# Patient Record
Sex: Male | Born: 1940 | Race: White | Hispanic: No | Marital: Married | Smoking: Former smoker
Health system: Southern US, Community
[De-identification: ages and names within clinical notes are randomized; demographics above are authoritative.]

## PROBLEM LIST (undated history)

## (undated) DIAGNOSIS — R350 Frequency of micturition: Secondary | ICD-10-CM

## (undated) DIAGNOSIS — M1990 Unspecified osteoarthritis, unspecified site: Secondary | ICD-10-CM

## (undated) HISTORY — PX: TONSILLECTOMY: SUR1361

## (undated) HISTORY — PX: KNEE ARTHROPLASTY: SHX992

## (undated) HISTORY — PX: APPENDECTOMY: SHX54

---

## 2000-03-04 ENCOUNTER — Emergency Department (HOSPITAL_COMMUNITY): Admission: EM | Admit: 2000-03-04 | Discharge: 2000-03-04 | Payer: Self-pay

## 2001-05-09 ENCOUNTER — Ambulatory Visit (HOSPITAL_COMMUNITY): Admission: RE | Admit: 2001-05-09 | Discharge: 2001-05-09 | Payer: Self-pay | Admitting: Gastroenterology

## 2003-01-03 ENCOUNTER — Ambulatory Visit (HOSPITAL_COMMUNITY): Admission: RE | Admit: 2003-01-03 | Discharge: 2003-01-03 | Payer: Self-pay | Admitting: Family Medicine

## 2003-02-11 ENCOUNTER — Ambulatory Visit (HOSPITAL_BASED_OUTPATIENT_CLINIC_OR_DEPARTMENT_OTHER): Admission: RE | Admit: 2003-02-11 | Discharge: 2003-02-11 | Payer: Self-pay | Admitting: Orthopedic Surgery

## 2003-05-15 ENCOUNTER — Inpatient Hospital Stay (HOSPITAL_COMMUNITY): Admission: RE | Admit: 2003-05-15 | Discharge: 2003-05-18 | Payer: Self-pay | Admitting: Orthopedic Surgery

## 2003-05-15 ENCOUNTER — Encounter: Payer: Self-pay | Admitting: Orthopedic Surgery

## 2015-04-17 ENCOUNTER — Other Ambulatory Visit: Payer: Self-pay | Admitting: Orthopedic Surgery

## 2015-04-17 DIAGNOSIS — Z01818 Encounter for other preprocedural examination: Secondary | ICD-10-CM

## 2015-04-22 ENCOUNTER — Other Ambulatory Visit (HOSPITAL_COMMUNITY): Payer: Self-pay | Admitting: Orthopedic Surgery

## 2015-04-22 ENCOUNTER — Encounter (HOSPITAL_COMMUNITY)
Admission: RE | Admit: 2015-04-22 | Discharge: 2015-04-22 | Disposition: A | Discharge status: Home / Self Care | Payer: Medicare HMO | Source: Ambulatory Visit | Attending: Orthopedic Surgery | Admitting: Orthopedic Surgery

## 2015-04-22 ENCOUNTER — Other Ambulatory Visit: Payer: Self-pay

## 2015-04-22 ENCOUNTER — Encounter (HOSPITAL_COMMUNITY): Payer: Self-pay

## 2015-04-22 DIAGNOSIS — Z87891 Personal history of nicotine dependence: Secondary | ICD-10-CM | POA: Insufficient documentation

## 2015-04-22 DIAGNOSIS — R9431 Abnormal electrocardiogram [ECG] [EKG]: Secondary | ICD-10-CM | POA: Diagnosis not present

## 2015-04-22 DIAGNOSIS — Z01812 Encounter for preprocedural laboratory examination: Secondary | ICD-10-CM | POA: Insufficient documentation

## 2015-04-22 DIAGNOSIS — Z01818 Encounter for other preprocedural examination: Secondary | ICD-10-CM | POA: Insufficient documentation

## 2015-04-22 DIAGNOSIS — R52 Pain, unspecified: Secondary | ICD-10-CM

## 2015-04-22 DIAGNOSIS — M179 Osteoarthritis of knee, unspecified: Secondary | ICD-10-CM | POA: Insufficient documentation

## 2015-04-22 DIAGNOSIS — R609 Edema, unspecified: Secondary | ICD-10-CM

## 2015-04-22 HISTORY — DX: Frequency of micturition: R35.0

## 2015-04-22 HISTORY — DX: Unspecified osteoarthritis, unspecified site: M19.90

## 2015-04-22 LAB — SURGICAL PCR SCREEN
MRSA, PCR: NEGATIVE
STAPHYLOCOCCUS AUREUS: NEGATIVE

## 2015-04-22 MED ORDER — CHLORHEXIDINE GLUCONATE 4 % EX LIQD: 60.0000 mL | Freq: Once | CUTANEOUS | Status: DC

## 2015-04-22 MED ORDER — DEXTROSE-NACL 5-0.45 % IV SOLN: INTRAVENOUS | Status: DC

## 2015-04-22 NOTE — Progress Notes (Signed)
Left message for patient informing that Dopplers have been scheduled for Wednesday, July 6th at 9:00 AM ([facility]).

## 2015-04-22 NOTE — Progress Notes (Signed)
PCP- Dr. Marjory LiesBrent Burnett at [facility] in [place]  Cardiologist - denies Echo/Stress Test/Cardiac Cath - denies CXR - June 2016 [place]

## 2015-04-22 NOTE — Progress Notes (Addendum)
   04/22/15 1631  OBSTRUCTIVE SLEEP APNEA  Have you ever been diagnosed with sleep apnea through a sleep study? No  Do you snore loudly (loud enough to be heard through closed doors)?  0  Do you often feel tired, fatigued, or sleepy during the daytime? 0  Has anyone observed you stop breathing during your sleep? 0  Do you have, or are you being treated for high blood pressure? 0  BMI more than 35 kg/m2? 1  Age over 73 years old? 1  Neck circumference greater than 40 cm/16 inches? 1  Gender: 1  Obstructive Sleep Apnea Score 4   Faxed to Dr. Doristine CounterBurnett at [facility] in [place].

## 2015-04-22 NOTE — Pre-Procedure Instructions (Signed)
    Matthew Mendez  04/22/2015      [facility] - [place], [place] - [address] [place] [id number] Phone: [phone] Fax: [phone]    Your procedure is scheduled on Monday, May 11, 2015 at 10:10 AM.  [facility] at 8:00 A.M.  Call this number if you have problems the morning of surgery:  [phone]   Remember:  Do not eat food or drink liquids after midnight.   Take these medicines the morning of surgery with A SIP OF WATER: None  Stop taking: Aspirin, Aleve, Naproxen, Mobic, Fish Oil, herbal medication, Ibuprofen, BC's, and Goody's 7 days prior to surgery (Monday, July 11th).    Do not wear jewelry.  Do not wear lotions, powders, or colognes.  You may wear deodorant.  Do not shave 48 hours prior to surgery.  Men may shave face and neck.  Do not bring valuables to the hospital.  [facility] is not responsible for any belongings or valuables.  Contacts, dentures or bridgework may not be worn into surgery.  Leave your suitcase in the car.  After surgery it may be brought to your room.  For patients admitted to the hospital, discharge time will be determined by your treatment team.  Patients discharged the day of surgery will not be allowed to drive home.   Special instructions:  See attached.   Please read over the following fact sheets that you were given. Pain Booklet, Coughing and Deep Breathing, Blood Transfusion Information, MRSA Information and Surgical Site Infection Prevention

## 2015-04-29 ENCOUNTER — Ambulatory Visit (HOSPITAL_COMMUNITY)
Admission: RE | Admit: 2015-04-29 | Discharge: 2015-04-29 | Disposition: A | Discharge status: Home / Self Care | Payer: Medicare HMO | Source: Ambulatory Visit | Attending: Orthopedic Surgery | Admitting: Orthopedic Surgery

## 2015-04-29 ENCOUNTER — Encounter (HOSPITAL_COMMUNITY)
Admission: RE | Admit: 2015-04-29 | Discharge: 2015-04-29 | Disposition: A | Discharge status: Home / Self Care | Payer: Medicare HMO | Source: Ambulatory Visit | Attending: Orthopedic Surgery | Admitting: Orthopedic Surgery

## 2015-04-29 DIAGNOSIS — M7989 Other specified soft tissue disorders: Secondary | ICD-10-CM | POA: Insufficient documentation

## 2015-04-29 DIAGNOSIS — R52 Pain, unspecified: Secondary | ICD-10-CM | POA: Diagnosis not present

## 2015-04-29 LAB — BASIC METABOLIC PANEL
Anion gap: 8 (ref 5–15)
BUN: 11 mg/dL (ref 6–20)
CHLORIDE: 106 mmol/L (ref 101–111)
CO2: 27 mmol/L (ref 22–32)
Calcium: 9.2 mg/dL (ref 8.9–10.3)
Creatinine, Ser: 0.98 mg/dL (ref 0.61–1.24)
GFR calc Af Amer: >60 mL/min (ref >60)
GLUCOSE: 112 mg/dL — AB (ref 65–99)
Potassium: 4.3 mmol/L (ref 3.5–5.1)
Sodium: 141 mmol/L (ref 135–145)

## 2015-04-29 LAB — CBC WITH DIFFERENTIAL/PLATELET
BASOS PCT: 1 % (ref 0–1)
Basophils Absolute: 0 10*3/uL (ref 0.0–0.1)
Eosinophils Absolute: 0.1 10*3/uL (ref 0.0–0.7)
Eosinophils Relative: 2 % (ref 0–5)
HCT: 44.7 % (ref 39.0–52.0)
Hemoglobin: 14.9 g/dL (ref 13.0–17.0)
Lymphocytes Relative: 27 % (ref 12–46)
Lymphs Abs: 1.7 10*3/uL (ref 0.7–4.0)
MCH: 31.2 pg (ref 26.0–34.0)
MCHC: 33.3 g/dL (ref 30.0–36.0)
MCV: 93.5 fL (ref 78.0–100.0)
Monocytes Absolute: 0.4 10*3/uL (ref 0.1–1.0)
Monocytes Relative: 7 % (ref 3–12)
NEUTROS PCT: 63 % (ref 43–77)
Neutro Abs: 4 10*3/uL (ref 1.7–7.7)
PLATELETS: 141 10*3/uL — AB (ref 150–400)
RBC: 4.78 MIL/uL (ref 4.22–5.81)
RDW: 13.3 % (ref 11.5–15.5)
WBC: 6.3 10*3/uL (ref 4.0–10.5)

## 2015-04-29 LAB — URINALYSIS, ROUTINE W REFLEX MICROSCOPIC
Bilirubin Urine: NEGATIVE
GLUCOSE, UA: NEGATIVE mg/dL
KETONES UR: NEGATIVE mg/dL
Leukocytes, UA: NEGATIVE
Nitrite: NEGATIVE
Protein, ur: NEGATIVE mg/dL
Specific Gravity, Urine: 1.025 (ref 1.005–1.030)
Urobilinogen, UA: 0.2 mg/dL (ref 0.0–1.0)
pH: 5 (ref 5.0–8.0)

## 2015-04-29 LAB — URINE MICROSCOPIC-ADD ON

## 2015-04-29 LAB — APTT: aPTT: 27 seconds (ref 24–37)

## 2015-04-29 LAB — ABO/RH: ABO/RH(D): O POS

## 2015-04-29 LAB — PROTIME-INR
INR: 0.99 (ref 0.00–1.49)
Prothrombin Time: 13.3 seconds (ref 11.6–15.2)

## 2015-04-29 LAB — TYPE AND SCREEN
ABO/RH(D): O POS
Antibody Screen: NEGATIVE

## 2015-04-29 NOTE — Progress Notes (Signed)
Preliminary results by tech - Left Lower Ext. Venous Duplex Completed. Negative for deep and superficial vein thrombosis in the left lower extremity. Jontavious Commons, BS, RDMS, RVT  

## 2015-05-09 NOTE — H&P (Signed)
TOTAL KNEE ADMISSION H&P  Patient is being admitted for left total knee arthroplasty.  Subjective:  Chief Complaint:left knee pain.  HPI: Matthew Mendez, 74 y.o. male, has a history of pain and functional disability in the left knee due to arthritis and has failed non-surgical conservative treatments for greater than 12 weeks to includeNSAID's and/or analgesics, flexibility and strengthening excercises, use of assistive devices, weight reduction as appropriate and activity modification.  Onset of symptoms was gradual, starting 3 years ago with gradually worsening course since that time. The patient noted no past surgery on the left knee(s).  Patient currently rates pain in the left knee(s) at 10 out of 10 with activity. Patient has night pain, worsening of pain with activity and weight bearing, pain that interferes with activities of daily living, pain with passive range of motion, crepitus and joint swelling.  Patient has evidence of subchondral sclerosis, periarticular osteophytes and joint space narrowing by imaging studies.  There is no active infection.  There are no active problems to display for this patient.  Past Medical History  Diagnosis Date  . Urinary frequency   . Arthritis     right shoulder    Past Surgical History  Procedure Laterality Date  . Knee arthroplasty Right   . Appendectomy    . Tonsillectomy      No prescriptions prior to admission   No Known Allergies  History  Substance Use Topics  . Smoking status: Former Smoker    Quit date: 01/23/1971  . Smokeless tobacco: Never Used  . Alcohol Use: No    No family history on file.   Review of Systems  Constitutional: Positive for malaise/fatigue.  HENT: Positive for hearing loss.   Cardiovascular: Positive for leg swelling.  Genitourinary: Positive for urgency and frequency.       ED and enlarged prostate  Musculoskeletal: Positive for myalgias.  Endo/Heme/Allergies: Bruises/bleeds easily.     Objective:  Physical Exam  Constitutional: He is oriented to person, place, and time. He appears well-developed and well-nourished.  HENT:  Head: Normocephalic and atraumatic.  Eyes: Pupils are equal, round, and reactive to light.  Neck: Normal range of motion. Neck supple.  Cardiovascular: Intact distal pulses.   Respiratory: Effort normal.  Musculoskeletal: He exhibits tenderness.  he has pain with palpation over the medial joint line of the left knee.  He has a range from approximately 5 to 115.  No instability.  His calves are soft and nontender however, the patient's left calf and ankle are significantly larger than the right.  Patient's right knee has good strength and good range of motion.    Neurological: He is alert and oriented to person, place, and time.  Skin: Skin is warm and dry.  Psychiatric: He has a normal mood and affect. His behavior is normal. Judgment and thought content normal.    Vital signs in last 24 hours:    Labs:   There is no height or weight on file to calculate BMI.   Imaging Review Plain radiographs demonstrate bilateral AP weightbearing, bilateral Rosenberg, lateral sunrise views of the left knee are taken and reviewed in office today.  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  Patient also has severe arthritis of the patellofemoral compartment.  Assessment/Plan:  End stage arthritis, left knee   The patient history, physical examination, clinical judgment of the provider and imaging studies are consistent with end stage degenerative joint disease of the left knee(s) and total  knee arthroplasty is deemed medically necessary. The treatment options including medical management, injection therapy arthroscopy and arthroplasty were discussed at length. The risks and benefits of total knee arthroplasty were presented and reviewed. The risks due to aseptic loosening, infection, stiffness, patella  tracking problems, thromboembolic complications and other imponderables were discussed. The patient acknowledged the explanation, agreed to proceed with the plan and consent was signed. Patient is being admitted for inpatient treatment for surgery, pain control, PT, OT, prophylactic antibiotics, VTE prophylaxis, progressive ambulation and ADL's and discharge planning. The patient is planning to be discharged home with home health services

## 2015-05-10 MED ORDER — DEXTROSE 5 % IV SOLN
3.0000 g | INTRAVENOUS | Status: AC
  Administered 2015-05-11: 3 g via INTRAVENOUS
  Filled 2015-05-10: qty 3000

## 2015-05-11 ENCOUNTER — Inpatient Hospital Stay (HOSPITAL_COMMUNITY)
Admission: RE | Admit: 2015-05-11 | Discharge: 2015-05-13 | DRG: 470 | Disposition: A | Discharge status: Home Health | Payer: Medicare HMO | Source: Ambulatory Visit | Attending: Orthopedic Surgery | Admitting: Orthopedic Surgery

## 2015-05-11 ENCOUNTER — Encounter (HOSPITAL_COMMUNITY)
Admission: RE | Disposition: A | Discharge status: Home Health | Payer: Self-pay | Source: Ambulatory Visit | Attending: Orthopedic Surgery

## 2015-05-11 ENCOUNTER — Encounter (HOSPITAL_COMMUNITY): Payer: Self-pay | Admitting: *Deleted

## 2015-05-11 ENCOUNTER — Inpatient Hospital Stay (HOSPITAL_COMMUNITY): Payer: Medicare HMO | Admitting: Emergency Medicine

## 2015-05-11 ENCOUNTER — Inpatient Hospital Stay (HOSPITAL_COMMUNITY): Payer: Medicare HMO | Admitting: Anesthesiology

## 2015-05-11 DIAGNOSIS — Z7982 Long term (current) use of aspirin: Secondary | ICD-10-CM

## 2015-05-11 DIAGNOSIS — M1710 Unilateral primary osteoarthritis, unspecified knee: Secondary | ICD-10-CM | POA: Diagnosis present

## 2015-05-11 DIAGNOSIS — Z87891 Personal history of nicotine dependence: Secondary | ICD-10-CM | POA: Diagnosis not present

## 2015-05-11 DIAGNOSIS — D62 Acute posthemorrhagic anemia: Secondary | ICD-10-CM | POA: Diagnosis not present

## 2015-05-11 DIAGNOSIS — M25562 Pain in left knee: Secondary | ICD-10-CM | POA: Diagnosis present

## 2015-05-11 DIAGNOSIS — M1712 Unilateral primary osteoarthritis, left knee: Secondary | ICD-10-CM | POA: Diagnosis present

## 2015-05-11 HISTORY — PX: TOTAL KNEE ARTHROPLASTY: SHX125

## 2015-05-11 SURGERY — ARTHROPLASTY, KNEE, TOTAL
Anesthesia: Monitor Anesthesia Care | Site: Knee | Laterality: Left

## 2015-05-11 MED ORDER — OXYCODONE-ACETAMINOPHEN 5-325 MG PO TABS: 1.0000 | ORAL_TABLET | ORAL | Status: DC | PRN

## 2015-05-11 MED ORDER — MENTHOL 3 MG MT LOZG: 1.0000 | LOZENGE | OROMUCOSAL | Status: DC | PRN

## 2015-05-11 MED ORDER — CEFUROXIME SODIUM 1.5 G IJ SOLR
INTRAMUSCULAR | Status: AC
  Filled 2015-05-11: qty 1.5

## 2015-05-11 MED ORDER — PROPOFOL 10 MG/ML IV BOLUS
INTRAVENOUS | Status: AC
  Filled 2015-05-11: qty 20

## 2015-05-11 MED ORDER — ACETAMINOPHEN 650 MG RE SUPP: 650.0000 mg | Freq: Four times a day (QID) | RECTAL | Status: DC | PRN

## 2015-05-11 MED ORDER — LACTATED RINGERS IV SOLN
INTRAVENOUS | Status: DC
  Administered 2015-05-11 (×2): via INTRAVENOUS

## 2015-05-11 MED ORDER — PNEUMOCOCCAL VAC POLYVALENT 25 MCG/0.5ML IJ INJ
0.5000 mL | INJECTION | INTRAMUSCULAR | Status: AC
  Administered 2015-05-12: 0.5 mL via INTRAMUSCULAR
  Filled 2015-05-11: qty 0.5

## 2015-05-11 MED ORDER — BUPIVACAINE IN DEXTROSE 0.75-8.25 % IT SOLN
INTRATHECAL | Status: DC | PRN
  Administered 2015-05-11: 15 mg via INTRATHECAL

## 2015-05-11 MED ORDER — PHENYLEPHRINE HCL 10 MG/ML IJ SOLN
INTRAMUSCULAR | Status: DC | PRN
  Administered 2015-05-11 (×2): 80 ug via INTRAVENOUS

## 2015-05-11 MED ORDER — SENNOSIDES-DOCUSATE SODIUM 8.6-50 MG PO TABS: 1.0000 | ORAL_TABLET | Freq: Every evening | ORAL | Status: DC | PRN

## 2015-05-11 MED ORDER — LIDOCAINE HCL (CARDIAC) 20 MG/ML IV SOLN
INTRAVENOUS | Status: DC | PRN
  Administered 2015-05-11: 100 mg via INTRAVENOUS

## 2015-05-11 MED ORDER — MELOXICAM 15 MG PO TABS
15.0000 mg | ORAL_TABLET | Freq: Every day | ORAL | Status: DC
  Administered 2015-05-11 – 2015-05-13 (×3): 15 mg via ORAL
  Filled 2015-05-11 (×4): qty 1

## 2015-05-11 MED ORDER — BISACODYL 5 MG PO TBEC: 5.0000 mg | DELAYED_RELEASE_TABLET | Freq: Every day | ORAL | Status: DC | PRN

## 2015-05-11 MED ORDER — BUPIVACAINE LIPOSOME 1.3 % IJ SUSP
20.0000 mL | INTRAMUSCULAR | Status: AC
  Administered 2015-05-11: 20 mL
  Filled 2015-05-11: qty 20

## 2015-05-11 MED ORDER — SODIUM CHLORIDE 0.9 % IV SOLN
10.0000 mg | INTRAVENOUS | Status: DC | PRN
  Administered 2015-05-11: 10 ug/min via INTRAVENOUS

## 2015-05-11 MED ORDER — ASPIRIN EC 325 MG PO TBEC
325.0000 mg | DELAYED_RELEASE_TABLET | Freq: Every day | ORAL | Status: DC
  Administered 2015-05-12 – 2015-05-13 (×2): 325 mg via ORAL
  Filled 2015-05-11 (×2): qty 1

## 2015-05-11 MED ORDER — ONDANSETRON HCL 4 MG PO TABS: 4.0000 mg | ORAL_TABLET | Freq: Four times a day (QID) | ORAL | Status: DC | PRN

## 2015-05-11 MED ORDER — PROMETHAZINE HCL 25 MG/ML IJ SOLN: 6.2500 mg | INTRAMUSCULAR | Status: DC | PRN

## 2015-05-11 MED ORDER — SODIUM CHLORIDE 0.9 % IJ SOLN
INTRAMUSCULAR | Status: DC | PRN
  Administered 2015-05-11: 40 mL via INTRAVENOUS

## 2015-05-11 MED ORDER — FENTANYL CITRATE (PF) 250 MCG/5ML IJ SOLN
INTRAMUSCULAR | Status: AC
  Filled 2015-05-11: qty 5

## 2015-05-11 MED ORDER — DIPHENHYDRAMINE HCL 12.5 MG/5ML PO ELIX: 12.5000 mg | ORAL_SOLUTION | ORAL | Status: DC | PRN

## 2015-05-11 MED ORDER — TIZANIDINE HCL 2 MG PO TABS: 2.0000 mg | ORAL_TABLET | Freq: Four times a day (QID) | ORAL | Status: DC | PRN

## 2015-05-11 MED ORDER — SODIUM CHLORIDE 0.9 % IR SOLN
Status: DC | PRN
  Administered 2015-05-11: 3000 mL
  Administered 2015-05-11: 1000 mL

## 2015-05-11 MED ORDER — MIDAZOLAM HCL 5 MG/5ML IJ SOLN
INTRAMUSCULAR | Status: DC | PRN
  Administered 2015-05-11: 2 mg via INTRAVENOUS

## 2015-05-11 MED ORDER — FENTANYL CITRATE (PF) 100 MCG/2ML IJ SOLN
INTRAMUSCULAR | Status: AC
  Filled 2015-05-11: qty 2

## 2015-05-11 MED ORDER — OXYCODONE HCL 5 MG PO TABS
5.0000 mg | ORAL_TABLET | ORAL | Status: DC | PRN
  Administered 2015-05-11 (×2): 10 mg via ORAL
  Filled 2015-05-11 (×2): qty 2

## 2015-05-11 MED ORDER — ONDANSETRON HCL 4 MG/2ML IJ SOLN: 4.0000 mg | Freq: Four times a day (QID) | INTRAMUSCULAR | Status: DC | PRN

## 2015-05-11 MED ORDER — CEFUROXIME SODIUM 1.5 G IJ SOLR
INTRAMUSCULAR | Status: DC | PRN
  Administered 2015-05-11: 1.5 g

## 2015-05-11 MED ORDER — METHOCARBAMOL 500 MG PO TABS
500.0000 mg | ORAL_TABLET | Freq: Four times a day (QID) | ORAL | Status: DC | PRN
  Administered 2015-05-11: 500 mg via ORAL
  Filled 2015-05-11: qty 1

## 2015-05-11 MED ORDER — ALUM & MAG HYDROXIDE-SIMETH 200-200-20 MG/5ML PO SUSP: 30.0000 mL | ORAL | Status: DC | PRN

## 2015-05-11 MED ORDER — MIDAZOLAM HCL 2 MG/2ML IJ SOLN
INTRAMUSCULAR | Status: AC
  Filled 2015-05-11: qty 2

## 2015-05-11 MED ORDER — PHENYLEPHRINE HCL 10 MG/ML IJ SOLN
INTRAMUSCULAR | Status: AC
  Filled 2015-05-11: qty 1

## 2015-05-11 MED ORDER — FLEET ENEMA 7-19 GM/118ML RE ENEM: 1.0000 | ENEMA | Freq: Once | RECTAL | Status: AC | PRN

## 2015-05-11 MED ORDER — METOCLOPRAMIDE HCL 5 MG/ML IJ SOLN: 5.0000 mg | Freq: Three times a day (TID) | INTRAMUSCULAR | Status: DC | PRN

## 2015-05-11 MED ORDER — TRANEXAMIC ACID 1000 MG/10ML IV SOLN
1000.0000 mg | INTRAVENOUS | Status: AC
  Administered 2015-05-11: 1000 mg via INTRAVENOUS
  Filled 2015-05-11: qty 10

## 2015-05-11 MED ORDER — PROPOFOL 10 MG/ML IV BOLUS
INTRAVENOUS | Status: DC | PRN
  Administered 2015-05-11: 20 mg via INTRAVENOUS
  Administered 2015-05-11: 30 mg via INTRAVENOUS
  Administered 2015-05-11: 20 mg via INTRAVENOUS
  Administered 2015-05-11: 50 mg via INTRAVENOUS

## 2015-05-11 MED ORDER — LIDOCAINE HCL (CARDIAC) 20 MG/ML IV SOLN
INTRAVENOUS | Status: AC
  Filled 2015-05-11: qty 5

## 2015-05-11 MED ORDER — ONDANSETRON HCL 4 MG/2ML IJ SOLN
INTRAMUSCULAR | Status: AC
  Filled 2015-05-11: qty 2

## 2015-05-11 MED ORDER — PHENOL 1.4 % MT LIQD: 1.0000 | OROMUCOSAL | Status: DC | PRN

## 2015-05-11 MED ORDER — PHENYLEPHRINE 40 MCG/ML (10ML) SYRINGE FOR IV PUSH (FOR BLOOD PRESSURE SUPPORT)
PREFILLED_SYRINGE | INTRAVENOUS | Status: AC
  Filled 2015-05-11: qty 10

## 2015-05-11 MED ORDER — DOCUSATE SODIUM 100 MG PO CAPS
100.0000 mg | ORAL_CAPSULE | Freq: Two times a day (BID) | ORAL | Status: DC
Start: 2015-05-11 — End: 2015-05-13
  Administered 2015-05-11 – 2015-05-13 (×5): 100 mg via ORAL
  Filled 2015-05-11 (×5): qty 1

## 2015-05-11 MED ORDER — FENTANYL CITRATE (PF) 100 MCG/2ML IJ SOLN
INTRAMUSCULAR | Status: DC | PRN
  Administered 2015-05-11: 25 ug via INTRAVENOUS

## 2015-05-11 MED ORDER — METOCLOPRAMIDE HCL 5 MG PO TABS: 5.0000 mg | ORAL_TABLET | Freq: Three times a day (TID) | ORAL | Status: DC | PRN

## 2015-05-11 MED ORDER — HYDROMORPHONE HCL 1 MG/ML IJ SOLN: 0.2500 mg | INTRAMUSCULAR | Status: DC | PRN

## 2015-05-11 MED ORDER — METHOCARBAMOL 1000 MG/10ML IJ SOLN
500.0000 mg | Freq: Four times a day (QID) | INTRAVENOUS | Status: DC | PRN
  Filled 2015-05-11: qty 5

## 2015-05-11 MED ORDER — HYDROMORPHONE HCL 1 MG/ML IJ SOLN
0.5000 mg | INTRAMUSCULAR | Status: DC | PRN
  Administered 2015-05-11: 1 mg via INTRAVENOUS
  Filled 2015-05-11: qty 1

## 2015-05-11 MED ORDER — ASPIRIN EC 325 MG PO TBEC: 325.0000 mg | DELAYED_RELEASE_TABLET | Freq: Two times a day (BID) | ORAL | Status: DC

## 2015-05-11 MED ORDER — KCL IN DEXTROSE-NACL 20-5-0.45 MEQ/L-%-% IV SOLN
INTRAVENOUS | Status: DC
  Administered 2015-05-11 – 2015-05-12 (×2): via INTRAVENOUS
  Filled 2015-05-11 (×2): qty 1000

## 2015-05-11 MED ORDER — ONDANSETRON HCL 4 MG/2ML IJ SOLN
INTRAMUSCULAR | Status: DC | PRN
  Administered 2015-05-11: 4 mg via INTRAVENOUS

## 2015-05-11 MED ORDER — PROPOFOL INFUSION 10 MG/ML OPTIME
INTRAVENOUS | Status: DC | PRN
  Administered 2015-05-11: 40 ug/kg/min via INTRAVENOUS

## 2015-05-11 MED ORDER — ACETAMINOPHEN 325 MG PO TABS
650.0000 mg | ORAL_TABLET | Freq: Four times a day (QID) | ORAL | Status: DC | PRN
  Administered 2015-05-12: 650 mg via ORAL
  Filled 2015-05-11: qty 2

## 2015-05-11 SURGICAL SUPPLY — 64 items
BANDAGE ELASTIC 6 VELCRO ST LF (GAUZE/BANDAGES/DRESSINGS) ×3 IMPLANT
BANDAGE ESMARK 6X9 LF (GAUZE/BANDAGES/DRESSINGS) ×1 IMPLANT
BLADE SAG 18X100X1.27 (BLADE) ×3 IMPLANT
BLADE SAW SGTL 13X75X1.27 (BLADE) ×3 IMPLANT
BNDG ELASTIC 6X10 VLCR STRL LF (GAUZE/BANDAGES/DRESSINGS) ×3 IMPLANT
BNDG ESMARK 6X9 LF (GAUZE/BANDAGES/DRESSINGS) ×3
BOWL SMART MIX CTS (DISPOSABLE) ×3 IMPLANT
CAPT KNEE TOTAL 3 ATTUNE ×3 IMPLANT
CEMENT HV SMART SET (Cement) ×3 IMPLANT
COVER SURGICAL LIGHT HANDLE (MISCELLANEOUS) ×3 IMPLANT
CUFF TOURNIQUET SINGLE 34IN LL (TOURNIQUET CUFF) ×3 IMPLANT
DRAPE EXTREMITY T 121X128X90 (DRAPE) ×3 IMPLANT
DRAPE U-SHAPE 47X51 STRL (DRAPES) ×3 IMPLANT
DURAPREP 26ML APPLICATOR (WOUND CARE) ×6 IMPLANT
ELECT BLADE 4.0 EZ CLEAN MEGAD (MISCELLANEOUS) ×3
ELECT REM PT RETURN 9FT ADLT (ELECTROSURGICAL) ×3
ELECTRODE BLDE 4.0 EZ CLN MEGD (MISCELLANEOUS) ×1 IMPLANT
ELECTRODE REM PT RTRN 9FT ADLT (ELECTROSURGICAL) ×1 IMPLANT
EVACUATOR 1/8 PVC DRAIN (DRAIN) IMPLANT
GAUZE SPONGE 4X4 12PLY STRL (GAUZE/BANDAGES/DRESSINGS) ×6 IMPLANT
GAUZE XEROFORM 1X8 LF (GAUZE/BANDAGES/DRESSINGS) ×3 IMPLANT
GLOVE BIO SURGEON STRL SZ 6 (GLOVE) ×3 IMPLANT
GLOVE BIO SURGEON STRL SZ7.5 (GLOVE) ×3 IMPLANT
GLOVE BIO SURGEON STRL SZ8.5 (GLOVE) ×6 IMPLANT
GLOVE BIOGEL PI IND STRL 6.5 (GLOVE) ×2 IMPLANT
GLOVE BIOGEL PI IND STRL 8 (GLOVE) ×1 IMPLANT
GLOVE BIOGEL PI IND STRL 9 (GLOVE) ×1 IMPLANT
GLOVE BIOGEL PI INDICATOR 6.5 (GLOVE) ×4
GLOVE BIOGEL PI INDICATOR 8 (GLOVE) ×2
GLOVE BIOGEL PI INDICATOR 9 (GLOVE) ×2
GOWN STRL REUS W/ TWL LRG LVL3 (GOWN DISPOSABLE) ×1 IMPLANT
GOWN STRL REUS W/ TWL XL LVL3 (GOWN DISPOSABLE) ×2 IMPLANT
GOWN STRL REUS W/TWL LRG LVL3 (GOWN DISPOSABLE) ×2
GOWN STRL REUS W/TWL XL LVL3 (GOWN DISPOSABLE) ×4
HANDPIECE INTERPULSE COAX TIP (DISPOSABLE) ×2
HOOD PEEL AWAY FACE SHEILD DIS (HOOD) ×6 IMPLANT
KIT BASIN OR (CUSTOM PROCEDURE TRAY) ×3 IMPLANT
KIT ROOM TURNOVER OR (KITS) ×3 IMPLANT
MANIFOLD NEPTUNE II (INSTRUMENTS) ×3 IMPLANT
NEEDLE SPNL 18GX3.5 QUINCKE PK (NEEDLE) ×3 IMPLANT
NS IRRIG 1000ML POUR BTL (IV SOLUTION) ×3 IMPLANT
PACK TOTAL JOINT (CUSTOM PROCEDURE TRAY) ×3 IMPLANT
PACK UNIVERSAL I (CUSTOM PROCEDURE TRAY) ×3 IMPLANT
PAD ABD 8X10 STRL (GAUZE/BANDAGES/DRESSINGS) ×3 IMPLANT
PAD ARMBOARD 7.5X6 YLW CONV (MISCELLANEOUS) ×3 IMPLANT
PADDING CAST COTTON 6X4 STRL (CAST SUPPLIES) ×3 IMPLANT
SET HNDPC FAN SPRY TIP SCT (DISPOSABLE) ×1 IMPLANT
SPONGE GAUZE 4X4 12PLY STER LF (GAUZE/BANDAGES/DRESSINGS) ×3 IMPLANT
SUCTION FRAZIER TIP 10 FR DISP (SUCTIONS) IMPLANT
SUT VIC AB 0 CT1 27 (SUTURE) ×2
SUT VIC AB 0 CT1 27XBRD ANBCTR (SUTURE) ×1 IMPLANT
SUT VIC AB 1 CTX 36 (SUTURE) ×2
SUT VIC AB 1 CTX36XBRD ANBCTR (SUTURE) ×1 IMPLANT
SUT VIC AB 2-0 CT1 27 (SUTURE) ×2
SUT VIC AB 2-0 CT1 TAPERPNT 27 (SUTURE) ×1 IMPLANT
SUT VIC AB 3-0 CT1 27 (SUTURE) ×2
SUT VIC AB 3-0 CT1 TAPERPNT 27 (SUTURE) ×1 IMPLANT
SUT VIC AB 3-0 FS2 27 (SUTURE) ×3 IMPLANT
SYR 30ML LL (SYRINGE) ×3 IMPLANT
SYR 50ML LL SCALE MARK (SYRINGE) ×3 IMPLANT
TOWEL OR 17X24 6PK STRL BLUE (TOWEL DISPOSABLE) ×3 IMPLANT
TOWEL OR 17X26 10 PK STRL BLUE (TOWEL DISPOSABLE) ×3 IMPLANT
TRAY CATH 16FR W/PLASTIC CATH (SET/KITS/TRAYS/PACK) ×3 IMPLANT
WATER STERILE IRR 1000ML POUR (IV SOLUTION) ×9 IMPLANT

## 2015-05-11 NOTE — Interval H&P Note (Signed)
History and Physical Interval Note:  05/11/2015 7:18 AM  Matthew Mendez  has presented today for surgery, with the diagnosis of LEFT KNEE OSTEOARTHRITIS  The various methods of treatment have been discussed with the patient and family. After consideration of risks, benefits and other options for treatment, the patient has consented to  Procedure(s) with comments: TOTAL KNEE ARTHROPLASTY (Left) - LEFT TOTAL KNEE ARTHROPLASTY as a surgical intervention .  The patient's history has been reviewed, patient examined, no change in status, stable for surgery.  I have reviewed the patient's chart and labs.  Questions were answered to the patient's satisfaction.     Nestor LewandowskyOWAN,Lynzee Lindquist J

## 2015-05-11 NOTE — Anesthesia Procedure Notes (Addendum)
Spinal Patient location during procedure: OR Start time: 05/11/2015 10:06 AM End time: 05/11/2015 10:14 AM Staffing Anesthesiologist: Heather RobertsSINGER, JAMES Performed by: anesthesiologist  Preanesthetic Checklist Completed: patient identified, surgical consent, pre-op evaluation, timeout performed, IV checked, risks and benefits discussed and monitors and equipment checked Spinal Block Patient position: sitting Prep: DuraPrep Patient monitoring: cardiac monitor, continuous pulse ox and blood pressure Approach: midline Location: L2-3 Injection technique: single-shot Needle Needle type: Pencan  Needle gauge: 24 G Needle length: 9 cm Additional Notes Functioning IV was confirmed and monitors were applied. Sterile prep and drape, including hand hygiene and sterile gloves were used. The patient was positioned and the spine was prepped. The skin was anesthetized with lidocaine.  Free flow of clear CSF was obtained prior to injecting local anesthetic into the CSF.  The spinal needle aspirated freely following injection.  The needle was carefully withdrawn.  The patient tolerated the procedure well.   Procedure Name: LMA Insertion Date/Time: 05/11/2015 10:41 AM Performed by: Bishop LimboARTER, Moosa Bueche S Pre-anesthesia Checklist: Patient identified, Emergency Drugs available, Suction available, Patient being monitored and Timeout performed Patient Re-evaluated:Patient Re-evaluated prior to inductionOxygen Delivery Method: Circle system utilized Preoxygenation: Pre-oxygenation with 100% oxygen Intubation Type: IV induction Ventilation: Mask ventilation without difficulty LMA: LMA inserted LMA Size: 5.0 Number of attempts: 1 Placement Confirmation: positive ETCO2,  CO2 detector and breath sounds checked- equal and bilateral Tube secured with: Tape Dental Injury: Teeth and Oropharynx as per pre-operative assessment     Procedure Name: MAC Date/Time: 05/11/2015 10:09 AM Performed by: Bishop LimboARTER, Haizlee Henton  S Pre-anesthesia Checklist: Patient identified, Timeout performed, Emergency Drugs available, Suction available and Patient being monitored Patient Re-evaluated:Patient Re-evaluated prior to inductionPlacement Confirmation: positive ETCO2

## 2015-05-11 NOTE — Discharge Instructions (Signed)

## 2015-05-11 NOTE — Op Note (Signed)
PATIENT ID:      Matthew Mendez  MRN:     [id number] DOB/AGE:    1941-01-18 / 74 y.o.       OPERATIVE REPORT    DATE OF PROCEDURE:  05/11/2015       PREOPERATIVE DIAGNOSIS:   LEFT KNEE OSTEOARTHRITIS      Estimated body mass index is 41.22 kg/(m^2) as calculated from the following:   Height as of this encounter:  (1.727 m).   Weight as of this encounter: 122.925 kg (271 lb).                                                        POSTOPERATIVE DIAGNOSIS:   LEFT KNEE OSTEOARTHRITIS                                                                      PROCEDURE:  Procedure(s): TOTAL KNEE ARTHROPLASTY Using DepuyAttune RP implants #7L Femur, #8Tibia, 8 mm Attune RP bearing, 41 Patella     SURGEON: Shealee Yordy J    ASSISTANT:   Eric K. [employer]   (Present and scrubbed throughout the case, critical for assistance with exposure, retraction, instrumentation, and closure.)         ANESTHESIA: Spinal, Exparel  EBL: 300  FLUID REPLACEMENT: 1600 crystalloid  TOURNIQUET TIME:  Drains: None  Tranexamic Acid: 1gm iv   COMPLICATIONS:  None         INDICATIONS FOR PROCEDURE: The patient has  LEFT KNEE OSTEOARTHRITIS, varus deformities, XR shows bone on bone arthritis. Patient has failed all conservative measures including anti-inflammatory medicines, narcotics, attempts at  exercise and weight loss, cortisone injections and viscosupplementation.  Risks and benefits of surgery have been discussed, questions answered.   DESCRIPTION OF PROCEDURE: The patient identified by armband, received  IV antibiotics, in the holding area at [facility]. Patient taken to the operating room, appropriate anesthetic  monitors were attached, and general endotracheal anesthesia induced with  the patient in supine position. Tourniquet  applied high to the operative thigh. Lateral post and foot positioner  applied to the table, the lower extremity was then prepped and draped  in usual sterile  fashion from the ankle to the tourniquet. Time-out procedure was performed. We began the operation, with the knee flexed 100 degrees, by making the anterior midline incision starting at handbreadth above the patella going over the patella 1 cm medial to and 4 cm distal to the tibial tubercle. Small bleeders in the skin and the  subcutaneous tissue identified and cauterized. Transverse retinaculum was incised and reflected medially and a medial parapatellar arthrotomy was accomplished. the patella was everted and theprepatellar fat pad resected. The superficial medial collateral  ligament was then elevated from anterior to posterior along the proximal  flare of the tibia and anterior half of the menisci resected. The knee was hyperflexed exposing bone on bone arthritis. Peripheral and notch osteophytes as well as the cruciate ligaments were then resected. We continued to  work our way around posteriorly along the proximal tibia, and externally  rotated the tibia subluxing it out from underneath the femur. A McHale  retractor was placed through the notch and a lateral Hohmann retractor  placed, and we then drilled through the proximal tibia in line with the  axis of the tibia followed by an intramedullary guide rod and 2-degree  posterior slope cutting guide. The tibial cutting guide, 3 degree posterior sloped, was pinned into place allowing resection of 0 mm of bone medially and about 11 mm of bone laterally. Satisfied with the tibial resection, we then  entered the distal femur 2 mm anterior to the PCL origin with the  intramedullary guide rod and applied the distal femoral cutting guide  set at 9mm, with 5 degrees of valgus. This was pinned along the  epicondylar axis. At this point, the distal femoral cut was accomplished without difficulty. We then sized for a #7L femoral component and pinned the guide in 3 degrees of external rotation.The chamfer cutting guide was pinned into place. The anterior,  posterior, and chamfer cuts were accomplished without difficulty followed by  the Attune RP box cutting guide and the box cut. We also removed posterior osteophytes from the posterior femoral condyles. At this  time, the knee was brought into full extension. We checked our  extension and flexion gaps and found them symmetric for a 8 mm bearing. Distracting in extension with a lamina spreader, the posterior horns of the menisci were removed, and Exparel, diluted to 60 cc, was injected into the capsule of the knee. The  posterior patella cut was accomplished with the 9.5 mm Attune cutting guide, sized at 41 dome, and the fixation pegs drilled.The knee  was then once again hyperflexed exposing the proximal tibia. We sized for a #8 tibial base plate, applied the smokestack and the conical reamer followed by the the Delta fin keel punch. We then hammered into place the Attune RP trial femoral component, inserted a  8 mm trial bearing, trial patellar button, and took the knee through range of motion from 0-130 degrees. No thumb pressure was required for patellar  Tracking. At this point, the limb was wrapped with an Esmarch bandage and the tourniquet inflated to 350 mmHg. All trial components were removed, mating surfaces irrigated with pulse lavage, and dried with suction and sponges. A double batch of DePuy HV cement with 1500 mg of Zinacef was mixed and applied to all bony metallic mating surfaces except for the posterior condyles of the femur itself. In order, we  hammered into place the tibial tray and removed excess cement, the femoral component and removed excess cement,  The 8mm  Attune RP bearing  was inserted, and the knee brought to full extension with compression.  The patellar button was clamped into place, and excess cement  removed. While the cement cured the wound was irrigated out with normal saline solution pulse lavage. Ligament stability and patellar tracking were checked and found to be  excellent. The parapatellar arthrotomy was closed with  running #1 Vicryl suture. The subcutaneous tissue with 0 and 2-0 undyed  Vicryl suture, and the skin with running 3-0 SQ vicryl. A dressing of Xeroform,  4 x 4, dressing sponges, Webril, and Ace wrap applied. The patient  awakened, extubated, and taken to recovery room without difficulty.   Jesusita Jocelyn J 05/11/2015, 11:43 AM

## 2015-05-11 NOTE — Transfer of Care (Signed)
Immediate Anesthesia Transfer of Care Note  Patient: Matthew Mendez  Procedure(s) Performed: Procedure(s) with comments: TOTAL KNEE ARTHROPLASTY (Left) - LEFT TOTAL KNEE ARTHROPLASTY  Patient Location: PACU  Anesthesia Type:General and Spinal  Level of Consciousness: awake, alert , oriented and patient cooperative  Airway & Oxygen Therapy: Patient Spontanous Breathing and Patient connected to nasal cannula oxygen  Post-op Assessment: Report given to RN, Post -op Vital signs reviewed and stable and Patient moving all extremities  Post vital signs: Reviewed and stable  Last Vitals:  Filed Vitals:   05/11/15 0703  BP: 119/49  Pulse:   Temp:   Resp:     Complications: No apparent anesthesia complications

## 2015-05-11 NOTE — Progress Notes (Signed)
Utilization review completed.  

## 2015-05-11 NOTE — Anesthesia Postprocedure Evaluation (Signed)
Anesthesia Post Note  Patient: Matthew Mendez  Procedure(s) Performed: Procedure(s) (LRB): TOTAL KNEE ARTHROPLASTY (Left)  Anesthesia type: MAC/SAB  Patient location: PACU  Post pain: Pain level controlled  Post assessment: Patient's Cardiovascular Status Stable, SAB receding  Last Vitals:  Filed Vitals:   05/11/15 1308  BP: 101/45  Pulse: 50  Temp:   Resp: 15    Post vital signs: Reviewed and stable  Level of consciousness: sedated  Complications: No apparent anesthesia complications

## 2015-05-11 NOTE — Plan of Care (Signed)
Problem: Consults Goal: Diagnosis- Total Joint Replacement Outcome: Completed/Met Date Met:  05/11/15 Primary Total Knee Left

## 2015-05-11 NOTE — Anesthesia Preprocedure Evaluation (Addendum)
Anesthesia Evaluation  Patient identified by MRN, date of birth, ID band Patient awake    Reviewed: Allergy & Precautions, NPO status , Patient's Chart, lab work & pertinent test results  History of Anesthesia Complications Negative for: history of anesthetic complications  Airway Mallampati: II  TM Distance: >3 FB Neck ROM: Full    Dental  (+) Teeth Intact, Dental Advisory Given   Pulmonary former smoker,    Pulmonary exam normal       Cardiovascular negative cardio ROS Normal cardiovascular exam    Neuro/Psych negative neurological ROS  negative psych ROS   GI/Hepatic negative GI ROS, Neg liver ROS,   Endo/Other    Renal/GU negative Renal ROS     Musculoskeletal   Abdominal   Peds  Hematology   Anesthesia Other Findings   Reproductive/Obstetrics                            Anesthesia Physical Anesthesia Plan  ASA: III  Anesthesia Plan: MAC   Post-op Pain Management:    Induction: Intravenous  Airway Management Planned: Simple Face Mask  Additional Equipment:   Intra-op Plan:   Post-operative Plan:   Informed Consent: I have reviewed the patients History and Physical, chart, labs and discussed the procedure including the risks, benefits and alternatives for the proposed anesthesia with the patient or authorized representative who has indicated his/her understanding and acceptance.   Dental advisory given  Plan Discussed with: CRNA, Anesthesiologist and Surgeon  Anesthesia Plan Comments:        Anesthesia Quick Evaluation

## 2015-05-11 NOTE — Progress Notes (Signed)
Orthopedic Tech Progress Note Patient Details:  Matthew Mendez 06/13/1941 [id number]  CPM Left Knee CPM Left Knee: On Left Knee Flexion (Degrees): 40 Left Knee Extension (Degrees): 0 Additional Comments: trapeze bar patient helper Viewed order from doctor's order list  Nikki DomCrawford, Kaoru Rezendes 05/11/2015, 1:31 PM

## 2015-05-12 ENCOUNTER — Encounter (HOSPITAL_COMMUNITY): Payer: Self-pay | Admitting: Orthopedic Surgery

## 2015-05-12 DIAGNOSIS — M1712 Unilateral primary osteoarthritis, left knee: Secondary | ICD-10-CM | POA: Diagnosis not present

## 2015-05-12 LAB — BASIC METABOLIC PANEL
ANION GAP: 7 (ref 5–15)
BUN: 6 mg/dL (ref 6–20)
CALCIUM: 7.9 mg/dL — AB (ref 8.9–10.3)
CO2: 26 mmol/L (ref 22–32)
Chloride: 100 mmol/L — ABNORMAL LOW (ref 101–111)
Creatinine, Ser: 0.97 mg/dL (ref 0.61–1.24)
GFR calc Af Amer: >60 mL/min (ref >60)
Glucose, Bld: 147 mg/dL — ABNORMAL HIGH (ref 65–99)
POTASSIUM: 4.4 mmol/L (ref 3.5–5.1)
Sodium: 133 mmol/L — ABNORMAL LOW (ref 135–145)

## 2015-05-12 LAB — CBC
HCT: 35.5 % — ABNORMAL LOW (ref 39.0–52.0)
Hemoglobin: 11.5 g/dL — ABNORMAL LOW (ref 13.0–17.0)
MCH: 30.6 pg (ref 26.0–34.0)
MCHC: 32.4 g/dL (ref 30.0–36.0)
MCV: 94.4 fL (ref 78.0–100.0)
Platelets: 136 10*3/uL — ABNORMAL LOW (ref 150–400)
RBC: 3.76 MIL/uL — ABNORMAL LOW (ref 4.22–5.81)
RDW: 13.5 % (ref 11.5–15.5)
WBC: 8.6 10*3/uL (ref 4.0–10.5)

## 2015-05-12 NOTE — Progress Notes (Signed)
Physical Therapy Treatment Patient Details Name: Matthew Mendez MRN: [id number] DOB: 1941-09-16 Today's Date: 05/12/2015    History of Present Illness Pt is a 74 y/o M s/p L TKA.  Pt's PMH includes R TKA and urinary frequency.     PT Comments    Min guard for all mobility as pt moves quickly and is impulsive at times.  Ambulated 100 ft in hallway.  Pt will benefit from continued skilled PT services to increase functional independence and safety.  Follow Up Recommendations  Home health PT;Supervision for mobility/OOB     Equipment Recommendations  3in1 (PT) (Bariatric BSC)    Recommendations for Other Services       Precautions / Restrictions Precautions Precautions: Fall;Knee Precaution Comments: reviewed Restrictions Weight Bearing Restrictions: Yes LLE Weight Bearing: Weight bearing as tolerated    Mobility  Bed Mobility Overal bed mobility: Needs Assistance Bed Mobility: Supine to Sit;Sit to Supine     Supine to sit: Min guard Sit to supine: Min guard   General bed mobility comments: Min guard for safety as pt is impulsive and moves quickly.  Cues for proper pursed lip breathing as pt demonstrates valsalva maneuver.  Transfers Overall transfer level: Needs assistance Equipment used: Rolling walker (2 wheeled) Transfers: Sit to/from Stand Sit to Stand: Min guard         General transfer comment: Min guard for safety as pt moves quickly and is slightly impulsive.    Ambulation/Gait Ambulation/Gait assistance: Min guard Ambulation Distance (Feet): 100 Feet Assistive device: Rolling walker (2 wheeled) Gait Pattern/deviations: Step-to pattern;Antalgic;Trunk flexed;Decreased weight shift to left;Decreased stride length;Decreased stance time - left   Gait velocity interpretation: Below normal speed for age/gender General Gait Details: Min guard for safety, pt continues to push RW in front but has improved w/ this.  Cues to allow B knees to bend during swing  phase as pt has tendency to shuffle.   Stairs            Wheelchair Mobility    Modified Rankin (Stroke Patients Only)       Balance Overall balance assessment: Needs assistance Sitting-balance support: Bilateral upper extremity supported;Feet supported Sitting balance-Leahy Scale: Good     Standing balance support: Bilateral upper extremity supported;During functional activity Standing balance-Leahy Scale: Poor Standing balance comment: RW for balance                    Cognition Arousal/Alertness: Awake/alert Behavior During Therapy: WFL for tasks assessed/performed Overall Cognitive Status: Within Functional Limits for tasks assessed                      Exercises Total Joint Exercises Ankle Circles/Pumps: AROM;Both;15 reps;Supine Long Arc Quad: AROM;Both;10 reps;Seated    General Comments        Pertinent Vitals/Pain Pain Assessment: 0-10 Pain Score: 1  Pain Location: L knee Pain Descriptors / Indicators: Aching Pain Intervention(s): Limited activity within patient's tolerance;Monitored during session;Repositioned    Home Living Family/patient expects to be discharged to:: Private residence Living Arrangements: Spouse/significant other Available Help at Discharge: Family;Available 24 hours/day Type of Home: Mobile home Home Access: Stairs to enter Entrance Stairs-Rails: Can reach both Home Layout: One level Home Equipment: Walker - 2 wheels;Bedside commode Additional Comments: Pt reports [facility]. Plans to possibly get a toilet riser.     Prior Function Level of Independence: Independent  PT Goals (current goals can now be found in the care plan section) Acute Rehab PT Goals Patient Stated Goal: to go home PT Goal Formulation: With patient Time For Goal Achievement: 05/19/15 Potential to Achieve Goals: Good Progress towards PT goals: Progressing toward goals    Frequency   7X/week    PT Plan Current plan remains appropriate    Co-evaluation             End of Session Equipment Utilized During Treatment: Gait belt Activity Tolerance: Patient limited by fatigue Patient left: in bed;with call bell/phone within reach;with family/visitor present     Time: 8657-8469 PT Time Calculation (min) (ACUTE ONLY): 17 min  Charges:  $Gait Training: 8-22 mins                    G Codes:      Michail Jewels PT, [place] [phone] Pager: [phone] 05/12/2015, 5:05 PM

## 2015-05-12 NOTE — Progress Notes (Signed)
PATIENT ID: Matthew Mendez  MRN: [id number]  DOB/AGE:  Mar 12, 1941 / 74 y.o.  1 Day Post-Op Procedure(s) (LRB): TOTAL KNEE ARTHROPLASTY (Left)    PROGRESS NOTE Subjective: Patient is alert, oriented, no Nausea, no Vomiting, yes passing gas, no Bowel Movement. Taking PO well with pt currently on a liquid diet.. Denies SOB, Chest or Calf Pain. Using Incentive Spirometer, PAS in place. Ambulate WBAT, CPM 0-90 Patient reports pain as 4 on 0-10 scale  .    Objective: Vital signs in last 24 hours: Filed Vitals:   05/11/15 1445 05/11/15 1518 05/11/15 1951 05/12/15 0557  BP:  117/56 118/60 122/58  Pulse:  64 72 82  Temp: 97.1 F (36.2 C) 98.1 F (36.7 C) 98.5 F (36.9 C) 98.9 F (37.2 C)  TempSrc:      Resp:  18 18 18   Height:      Weight:      SpO2:  99% 96% 93%      Intake/Output from previous day: I/O last 3 completed shifts: In: 3958.8 [P.O.:440; I.V.:3518.8] Out: 750 [Urine:650; Blood:100]   Intake/Output this shift:     LABORATORY DATA:  Recent Labs  05/12/15 0500  WBC 8.6  HGB 11.5*  HCT 35.5*  PLT 136*  NA 133*  K 4.4  CL 100*  CO2 26  BUN 6  CREATININE 0.97  GLUCOSE 147*  CALCIUM 7.9*    Examination: Neurologically intact Neurovascular intact Sensation intact distally Intact pulses distally Dorsiflexion/Plantar flexion intact Incision: dressing C/D/I No cellulitis present Compartment soft}  Assessment:   1 Day Post-Op Procedure(s) (LRB): TOTAL KNEE ARTHROPLASTY (Left) ADDITIONAL DIAGNOSIS: Expected Acute Blood Loss Anemia,  Plan: PT/OT WBAT, CPM 5/hrs day until ROM 0-90 degrees, then D/C CPM DVT Prophylaxis:  SCDx72hrs, ASA 325 mg BID x 2 weeks DISCHARGE PLAN: Home DISCHARGE NEEDS: HHPT, HHRN, CPM, Walker and 3-in-1 comode seat     Charnele Semple R 05/12/2015, 8:14 AM

## 2015-05-12 NOTE — Evaluation (Signed)
Physical Therapy Evaluation Patient Details Name: Matthew Mendez MRN: [id number] DOB: 1941-03-20 Today's Date: 05/12/2015   History of Present Illness  Pt is a 74 y/o M s/p L TKA.  Pt's PMH includes R TKA and urinary frequency.   Clinical Impression  Pt is s/p L TKA resulting in the deficits listed below (see PT Problem List). Min guard bed mobility and sit<>stand, min assist during ambulation managing RW.  Pt will benefit from skilled PT to increase their independence and safety with mobility to allow discharge to the venue listed below.     Follow Up Recommendations Home health PT;Supervision for mobility/OOB    Equipment Recommendations  3in1 (PT) (Bariatric BSC)    Recommendations for Other Services OT consult     Precautions / Restrictions Precautions Precautions: Fall;Knee Precaution Booklet Issued: Yes (comment) Precaution Comments: Reviewed no pillow under knee Restrictions Weight Bearing Restrictions: Yes LLE Weight Bearing: Weight bearing as tolerated      Mobility  Bed Mobility Overal bed mobility: Needs Assistance Bed Mobility: Supine to Sit     Supine to sit: Min guard     General bed mobility comments: Cues for technique, increased time and use of bed rails.    Transfers Overall transfer level: Needs assistance Equipment used: Rolling walker (2 wheeled) Transfers: Sit to/from Stand Sit to Stand: Min guard         General transfer comment: Cues for proper hand placment and min guard for safety.  Uncontrolled descent to recliner chair during stand>sit.  Ambulation/Gait Ambulation/Gait assistance: Min assist Ambulation Distance (Feet): 25 Feet Assistive device: Rolling walker (2 wheeled) Gait Pattern/deviations: Step-to pattern;Antalgic;Decreased weight shift to left;Decreased dorsiflexion - right;Decreased dorsiflexion - left;Decreased stance time - left;Trunk flexed   Gait velocity interpretation: Below normal speed for age/gender General Gait  Details: Min assist managing RW as he has tendency to push it in front of him despite VCs.  Poor foot clearnace w/ RLE, shuffle prior to initial contact.  Standing rest break x1 as pt needed to use urinal.  Stairs            Wheelchair Mobility    Modified Rankin (Stroke Patients Only)       Balance Overall balance assessment: Needs assistance Sitting-balance support: Feet supported;Bilateral upper extremity supported Sitting balance-Leahy Scale: Good     Standing balance support: Bilateral upper extremity supported;During functional activity Standing balance-Leahy Scale: Poor Standing balance comment: relies heavily on RW for support                             Pertinent Vitals/Pain Pain Assessment: 0-10 Pain Score: 7  Pain Location: L knee Pain Descriptors / Indicators: Aching;Sore Pain Intervention(s): Monitored during session;Limited activity within patient's tolerance;Repositioned    Home Living Family/patient expects to be discharged to:: Private residence Living Arrangements: Spouse/significant other Available Help at Discharge: Family;Available 24 hours/day (wife and daughter 24/7) Type of Home: Mobile home Home Access: Stairs to enter Entrance Stairs-Rails: Can reach both Entrance Stairs-Number of Steps: 5 Home Layout: One level Home Equipment: Walker - 2 wheels;Bedside commode (BSC is too narrow, needs bariatric BSC) Additional Comments: Wife takes care of pt's granddaughter during the day while parents are at work.  Pt says his wife will still be able to assist him during the day and the daughter will be available prn if they need further assist.    Prior Function Level of Independence: Independent  Hand Dominance        Extremity/Trunk Assessment               Lower Extremity Assessment: LLE deficits/detail   LLE Deficits / Details: weakness and limited ROM as expected s/ p L TKA     Communication    Communication: No difficulties  Cognition Arousal/Alertness: Awake/alert Behavior During Therapy: WFL for tasks assessed/performed Overall Cognitive Status: Within Functional Limits for tasks assessed                      General Comments      Exercises Total Joint Exercises Ankle Circles/Pumps: AROM;Both;15 reps;Supine Quad Sets: AROM;Both;10 reps;Supine Heel Slides: AROM;Left;10 reps;Supine Knee Flexion: AROM;Left;5 reps;Seated Goniometric ROM: 9-92      Assessment/Plan    PT Assessment Patient needs continued PT services  PT Diagnosis Difficulty walking;Abnormality of gait;Generalized weakness;Acute pain   PT Problem List Decreased strength;Decreased range of motion;Decreased activity tolerance;Decreased balance;Decreased mobility;Decreased coordination;Decreased knowledge of use of DME;Decreased safety awareness;Decreased knowledge of precautions;Pain;Obesity;Decreased skin integrity  PT Treatment Interventions DME instruction;Functional mobility training;Gait training;Stair training;Therapeutic activities;Therapeutic exercise;Balance training;Neuromuscular re-education;Patient/family education;Modalities   PT Goals (Current goals can be found in the Care Plan section) Acute Rehab PT Goals Patient Stated Goal: to go home PT Goal Formulation: With patient Time For Goal Achievement: 05/19/15 Potential to Achieve Goals: Good    Frequency 7X/week   Barriers to discharge Inaccessible home environment 5 steps to enter home    Co-evaluation               End of Session Equipment Utilized During Treatment: Gait belt Activity Tolerance: Patient limited by fatigue;Patient limited by pain Patient left: in chair;with call bell/phone within reach Nurse Communication: Mobility status;Precautions;Weight bearing status         Time: 2440-10271057-1126 PT Time Calculation (min) (ACUTE ONLY): 29 min   Charges:   PT Evaluation $Initial PT Evaluation Tier I: 1  Procedure PT Treatments $Therapeutic Exercise: 8-22 mins   PT G Codes:        Michail JewelsAshley Parr PT, DPT [phone] Pager: [phone] 05/12/2015, 11:42 AM

## 2015-05-12 NOTE — Care Management Note (Signed)
Case Management Note  Patient Details  Name: Matthew Mendez MRN: [id number] Date of Birth: 22-Dec-1940  Subjective/Objective:      74 yr old male s/p left total knee arthroplasty.              Action/Plan:  Case manager spoke with patient concerning home health and DME needs. Choice offered. [facility]. Patient states he was delivered a commode but needs the larger one. Case manager contacted Kipp BroodBrent with [employer] concerning this. Patient has family support at discharge.     Expected Discharge Date:   05/13/15               Expected Discharge Plan:  Home w Home Health Services  In-House Referral:  NA  Discharge planning Services  CM Consult  Post Acute Care Choice:  Durable Medical Equipment, Home Health Choice offered to:  Patient  DME Arranged:  3-N-1, Walker rolling, CPM DME Agency:  [employer]  HH Arranged:  PT HH Agency:  [facility]  Status of Service:  Completed, signed off  Medicare Important Message Given:    Date Medicare IM Given:    Medicare IM give by:    Date Additional Medicare IM Given:    Additional Medicare Important Message give by:     If discussed at Long Length of Stay Meetings, dates discussed:    Additional Comments:  Durenda GuthrieBrady, Arrianna Catala Naomi, RN 05/12/2015, 12:15 PM

## 2015-05-12 NOTE — Progress Notes (Signed)
Orthopedic Tech Progress Note Patient Details:  Matthew Mendez [birth year] [id number] On cpm at 7:00 pm Patient ID: Matthew Mendez, male   DOB: [birth year], 74 y.o.   MRN: [id number]   Matthew Mendez, Matthew Mendez 05/12/2015, 6:59 PM

## 2015-05-12 NOTE — Progress Notes (Signed)
Occupational Therapy Evaluation Patient Details Name: Matthew Mendez MRN: [id number] DOB: 02-Nov-1940 Today's Date: 05/12/2015    History of Present Illness Pt is a 74 y/o M s/p L TKA.  Pt's PMH includes R TKA and urinary frequency.    Clinical Impression   Pt admitted with the above diagnoses and presents with below problem list. Pt will benefit from continued acute OT to address the below listed deficits and maximize independence with BADLs prior to d/c home with family. PTA pt was independent with ADLs. Pt is currently min guard for LB ADLs and min A for functional mobility. OT to continue to follow acutely.     Follow Up Recommendations  Supervision/Assistance - 24 hour;No OT follow up    Equipment Recommendations  None recommended by OT    Recommendations for Other Services       Precautions / Restrictions Precautions Precautions: Fall;Knee Precaution Booklet Issued: Yes (comment) Precaution Comments: reviewed Restrictions Weight Bearing Restrictions: Yes LLE Weight Bearing: Weight bearing as tolerated      Mobility Bed Mobility Overal bed mobility: Needs Assistance Bed Mobility: Supine to Sit;Sit to Supine     Supine to sit: Min guard Sit to supine: Min guard   General bed mobility comments: Min guard for safety. Heavy use of rails and increased time and effort.  Transfers Overall transfer level: Needs assistance Equipment used: Rolling walker (2 wheeled) Transfers: Sit to/from Stand Sit to Stand: Min guard;From elevated surface         General transfer comment: cues for technique with rw    Balance Overall balance assessment: Needs assistance Sitting-balance support: Feet supported;Bilateral upper extremity supported Sitting balance-Leahy Scale: Good     Standing balance support: Bilateral upper extremity supported;During functional activity Standing balance-Leahy Scale: Poor Standing balance comment: rw for balance                            ADL Overall ADL's : Needs assistance/impaired Eating/Feeding: Set up;Sitting   Grooming: Set up;Sitting   Upper Body Bathing: Set up;Sitting   Lower Body Bathing: Sit to/from stand;Min guard;With adaptive equipment   Upper Body Dressing : Set up;Sitting   Lower Body Dressing: Min guard;With adaptive equipment;Sit to/from stand   Toilet Transfer: Minimal assistance;Ambulation;RW Toilet Transfer Details (indicate cue type and reason): min a for balance and for positioning rw.  Toileting- ArchitectClothing Manipulation and Hygiene: Min guard;Sit to/from stand   Tub/ Shower Transfer: Minimal assistance;Ambulation;3 in 1;Rolling walker Tub/Shower Transfer Details (indicate cue type and reason): Educated on setup for shower transfer at home Functional mobility during ADLs: Minimal assistance;Rolling walker General ADL Comments: Pt ambulated to/from bathroom with min a for control of positioning rw and balance. Cues to narrow distance between body and rw. Educated on techniques and AE for LB ADLs. Spouse to assist at home.      Vision     Perception     Praxis      Pertinent Vitals/Pain Pain Assessment: 0-10 Pain Score: 4  Pain Location: L knee Pain Descriptors / Indicators: Aching;Sore Pain Intervention(s): Limited activity within patient's tolerance;Monitored during session;Repositioned     Hand Dominance     Extremity/Trunk Assessment Upper Extremity Assessment Upper Extremity Assessment: Overall WFL for tasks assessed   Lower Extremity Assessment Lower Extremity Assessment: Defer to PT evaluation LLE Deficits / Details: weakness and limited ROM as expected s/ p L TKA LLE Sensation:  (WNL)       Communication Communication  Communication: No difficulties   Cognition Arousal/Alertness: Awake/alert Behavior During Therapy: WFL for tasks assessed/performed Overall Cognitive Status: Within Functional Limits for tasks assessed                     General  Comments       Exercises Exercises: Total Joint     Shoulder Instructions      Home Living Family/patient expects to be discharged to:: Private residence Living Arrangements: Spouse/significant other Available Help at Discharge: Family;Available 24 hours/day Type of Home: Mobile home Home Access: Stairs to enter Entrance Stairs-Number of Steps: 5 Entrance Stairs-Rails: Can reach both Home Layout: One level     Bathroom Shower/Tub: Producer, television/film/video: Standard     Home Equipment: Environmental consultant - 2 wheels;Bedside commode   Additional Comments: Pt reports he was told he cannot trade out [facility] for larger one. Plans to possibly get a toilet riser.       Prior Functioning/Environment Level of Independence: Independent             OT Diagnosis: Acute pain   OT Problem List: Impaired balance (sitting and/or standing);Decreased knowledge of use of DME or AE;Decreased knowledge of precautions;Pain   OT Treatment/Interventions: Self-care/ADL training;DME and/or AE instruction;Therapeutic activities;Patient/family education;Balance training    OT Goals(Current goals can be found in the care plan section) Acute Rehab OT Goals Patient Stated Goal: to go home OT Goal Formulation: With patient/family Time For Goal Achievement: 05/19/15 Potential to Achieve Goals: Good ADL Goals Pt Will Perform Lower Body Bathing: with modified independence;with adaptive equipment;sit to/from stand Pt Will Perform Lower Body Dressing: with modified independence;with adaptive equipment;sit to/from stand Pt Will Transfer to Toilet: with modified independence;ambulating (3n1 over toilet) Pt Will Perform Toileting - Clothing Manipulation and hygiene: with modified independence;sit to/from stand Pt Will Perform Tub/Shower Transfer: Shower transfer;ambulating;with modified independence;3 in 1;rolling walker  OT Frequency: Min 2X/week   Barriers to D/C:            Co-evaluation               End of Session Equipment Utilized During Treatment: Gait belt;Rolling walker CPM Left Knee CPM Left Knee: On Left Knee Flexion (Degrees): 50  Activity Tolerance: Patient tolerated treatment well Patient left: in bed;with call bell/phone within reach;with family/visitor present;Other (comment) (in CPM)   Time: 1340-1401 OT Time Calculation (min): 21 min Charges:  OT General Charges $OT Visit: 1 Procedure OT Evaluation $Initial OT Evaluation Tier I: 1 Procedure G-Codes:    Pilar Grammes 05/23/2015, 2:22 PM  Raynald Kemp OTR/L Pager: [phone]

## 2015-05-13 LAB — CBC
HEMATOCRIT: 30.2 % — AB (ref 39.0–52.0)
HEMOGLOBIN: 10.2 g/dL — AB (ref 13.0–17.0)
MCH: 31.6 pg (ref 26.0–34.0)
MCHC: 33.8 g/dL (ref 30.0–36.0)
MCV: 93.5 fL (ref 78.0–100.0)
Platelets: 111 10*3/uL — ABNORMAL LOW (ref 150–400)
RBC: 3.23 MIL/uL — AB (ref 4.22–5.81)
RDW: 13.1 % (ref 11.5–15.5)
WBC: 9.8 10*3/uL (ref 4.0–10.5)

## 2015-05-13 NOTE — Progress Notes (Signed)
Physical Therapy Treatment Patient Details Name: Matthew Mendez MRN: [id number] DOB: 10/19/1941 Today's Date: 05/13/2015    History of Present Illness Pt is a 74 y/o M s/p L TKA.  Pt's PMH includes R TKA and urinary frequency.     PT Comments    Patient is making good progress with PT.  From a mobility standpoint anticipate patient will be ready for DC home today.  Pt demonstrated ability to ambulate 250 ft and ascend/descend 6 steps this session w/ min guard assist.Pt will benefit from continued skilled PT services to increase functional independence and safety.     Follow Up Recommendations  Home health PT;Supervision for mobility/OOB     Equipment Recommendations  3in1 (PT) (Bariatric BSC)    Recommendations for Other Services       Precautions / Restrictions Precautions Precautions: Fall;Knee Precaution Comments: reviewed Restrictions Weight Bearing Restrictions: Yes LLE Weight Bearing: Weight bearing as tolerated    Mobility  Bed Mobility Overal bed mobility: Needs Assistance Bed Mobility: Supine to Sit     Supine to sit: Min guard     General bed mobility comments: Min guard for safety.  HOB flat and no use of bed rails.  Increased time and cues for proper breathing techniques as pt demonstrates valsalva maneuver.  Transfers Overall transfer level: Needs assistance Equipment used: Rolling walker (2 wheeled) Transfers: Sit to/from Stand Sit to Stand: Min guard         General transfer comment: Min guard for safety.  Pt continues to hold onto RW during sit<>stand and requires max VCs for proper hand placement during transfers.  Uncontrolled descent when sitting so practiced sit<>stand x4 to ensure proper technique.  Ambulation/Gait Ambulation/Gait assistance: Min guard Ambulation Distance (Feet): 250 Feet Assistive device: Rolling walker (2 wheeled) Gait Pattern/deviations: Step-to pattern;Step-through pattern;Shuffle;Antalgic;Trunk flexed;Decreased  stride length;Decreased stance time - left;Decreased weight shift to left   Gait velocity interpretation: Below normal speed for age/gender General Gait Details: Min guard for safety.  Cues to stand upright and to pick feet off ground durin swing phase to prevent shuffle.     Stairs Stairs: Yes Stairs assistance: Supervision Stair Management: Two rails;Step to pattern;Forwards Number of Stairs: 6 General stair comments: VCs for proper sequencing and to slow down to ensure safety and to maintain balance.  Wheelchair Mobility    Modified Rankin (Stroke Patients Only)       Balance Overall balance assessment: Needs assistance Sitting-balance support: No upper extremity supported;Feet supported Sitting balance-Leahy Scale: Good     Standing balance support: Bilateral upper extremity supported;During functional activity Standing balance-Leahy Scale: Fair Standing balance comment: Pt able to stand unsupported using urinal w/o assist                    Cognition Arousal/Alertness: Awake/alert Behavior During Therapy: WFL for tasks assessed/performed Overall Cognitive Status: Within Functional Limits for tasks assessed       Memory: Decreased short-term memory              Exercises Total Joint Exercises Long Arc Quad: AROM;Left;10 reps;Seated;Limitations Long [employer] Quad Limitations: Pt leans posteriorly and requires VCs to maintain upright posture Knee Flexion: AROM;Left;10 reps;Seated;Standing Goniometric ROM: 86 deg L knee flexion    General Comments        Pertinent Vitals/Pain Pain Assessment: No/denies pain Pain Score: 0-No pain Pain Intervention(s): Limited activity within patient's tolerance;Monitored during session;Repositioned    Home Living  Prior Function            PT Goals (current goals can now be found in the care plan section) Acute Rehab PT Goals Patient Stated Goal: to go home PT Goal Formulation: With  patient/family Time For Goal Achievement: 05/19/15 Potential to Achieve Goals: Good Progress towards PT goals: Progressing toward goals    Frequency  7X/week    PT Plan Current plan remains appropriate    Co-evaluation             End of Session Equipment Utilized During Treatment: Gait belt Activity Tolerance: Patient tolerated treatment well Patient left: in chair;with call bell/phone within reach;with family/visitor present     Time: 1610-9604 PT Time Calculation (min) (ACUTE ONLY): 25 min  Charges:  $Gait Training: 8-22 mins $Therapeutic Exercise: 8-22 mins                    G Codes:      Michail Jewels PT, DPT [phone] Pager: [phone] 05/13/2015, 11:13 AM

## 2015-05-13 NOTE — Progress Notes (Signed)
PATIENT ID: Matthew Mendez  MRN: [id number]  DOB/AGE:  1941-05-15 / 74 y.o.  2 Days Post-Op Procedure(s) (LRB): TOTAL KNEE ARTHROPLASTY (Left)    PROGRESS NOTE Subjective: Patient is alert, oriented, no Nausea, no Vomiting, yes passing gas, no Bowel Movement. Taking PO well. Denies SOB, Chest or Calf Pain. Using Incentive Spirometer, PAS in place. Ambulate WBAT with pt walking 100 ft, CPM 0-90 Patient reports pain as mild  .    Objective: Vital signs in last 24 hours: Filed Vitals:   05/12/15 0557 05/12/15 1335 05/12/15 2300 05/13/15 0708  BP: 122/58 120/64 109/55 114/51  Pulse: 82 100 94 94  Temp: 98.9 F (37.2 C) 100.1 F (37.8 C) 100 F (37.8 C) 98.8 F (37.1 C)  TempSrc:   Oral Oral  Resp: 18 20 16 16   Height:      Weight:      SpO2: 93% 98% 94% 95%      Intake/Output from previous day: I/O last 3 completed shifts: In: 2702.2 [P.O.:840; I.V.:1862.2] Out: 1025 [Urine:1025]   Intake/Output this shift:     LABORATORY DATA:  Recent Labs  05/12/15 0500 05/13/15 0434  WBC 8.6 9.8  HGB 11.5* 10.2*  HCT 35.5* 30.2*  PLT 136* PENDING  NA 133*  --   K 4.4  --   CL 100*  --   CO2 26  --   BUN 6  --   CREATININE 0.97  --   GLUCOSE 147*  --   CALCIUM 7.9*  --     Examination: Neurologically intact Neurovascular intact Sensation intact distally Intact pulses distally Dorsiflexion/Plantar flexion intact Incision: dressing C/D/I No cellulitis present Compartment soft}  Assessment:   2 Days Post-Op Procedure(s) (LRB): TOTAL KNEE ARTHROPLASTY (Left) ADDITIONAL DIAGNOSIS: Expected Acute Blood Loss Anemia,   Plan: PT/OT WBAT, CPM 5/hrs day until ROM 0-90 degrees, then D/C CPM DVT Prophylaxis:  SCDx72hrs, ASA 325 mg BID x 2 weeks DISCHARGE PLAN: Home, when pt meets therapy goals DISCHARGE NEEDS: HHPT, HHRN, CPM, Walker and 3-in-1 comode seat     Aaliyha Mumford R 05/13/2015, 8:12 AM

## 2015-05-13 NOTE — Care Management Important Message (Signed)
Important Message  Patient Details  Name: Matthew Mendez MRN: [id number] Date of Birth: Nov 26, 1940   Medicare Important Message Given:  Yes-second notification given    Orson AloeMegan P Helana Macbride 05/13/2015, 1:06 PM

## 2015-05-13 NOTE — Discharge Summary (Signed)
Patient ID: Matthew Mendez MRN: [id number] DOB/AGE: 1941/05/13 74 y.o.  Admit date: 05/11/2015 Discharge date: 05/13/2015  Admission Diagnoses:  Active Problems:   Arthritis of knee   Discharge Diagnoses:  Same  Past Medical History  Diagnosis Date  . Urinary frequency   . Arthritis     right shoulder    Surgeries: Procedure(s): TOTAL KNEE ARTHROPLASTY on 05/11/2015   Consultants:    Discharged Condition: Improved  Hospital Course: Matthew Mendez is an 74 y.o. male who was admitted 05/11/2015 for operative treatment of<principal problem not specified>. Patient has severe unremitting pain that affects sleep, daily activities, and work/hobbies. After pre-op clearance the patient was taken to the operating room on 05/11/2015 and underwent  Procedure(s): TOTAL KNEE ARTHROPLASTY.    Patient was given perioperative antibiotics: Anti-infectives    Start     Dose/Rate Route Frequency Ordered Stop   05/11/15 1059  cefUROXime (ZINACEF) injection  Status:  Discontinued       As needed 05/11/15 1059 05/11/15 1218   05/11/15 0900  ceFAZolin (ANCEF) 3 g in dextrose 5 % 50 mL IVPB     3 g 160 mL/hr over 30 Minutes Intravenous On call to O.R. 05/10/15 1240 05/11/15 1014       Patient was given sequential compression devices, early ambulation, and chemoprophylaxis to prevent DVT.  Patient benefited maximally from hospital stay and there were no complications.    Recent vital signs: Patient Vitals for the past 24 hrs:  BP Temp Temp src Pulse Resp SpO2  05/13/15 0708 (!) 114/51 mmHg 98.8 F (37.1 C) Oral 94 16 95 %  05/12/15 2300 (!) 109/55 mmHg 100 F (37.8 C) Oral 94 16 94 %  05/12/15 1335 120/64 mmHg 100.1 F (37.8 C) - 100 20 98 %     Recent laboratory studies:  Recent Labs  05/12/15 0500 05/13/15 0434  WBC 8.6 9.8  HGB 11.5* 10.2*  HCT 35.5* 30.2*  PLT 136* PENDING  NA 133*  --   K 4.4  --   CL 100*  --   CO2 26  --   BUN 6  --   CREATININE 0.97  --    GLUCOSE 147*  --   CALCIUM 7.9*  --      Discharge Medications:     Medication List    STOP taking these medications        meloxicam 15 MG tablet  Commonly known as:  MOBIC     naproxen sodium 220 MG tablet  Commonly known as:  ANAPROX      TAKE these medications        aspirin EC 325 MG tablet  Take 1 tablet (325 mg total) by mouth 2 (two) times daily.     oxyCODONE-acetaminophen 5-325 MG per tablet  Commonly known as:  ROXICET  Take 1 tablet by mouth every 4 (four) hours as needed.     tiZANidine 2 MG tablet  Commonly known as:  ZANAFLEX  Take 1 tablet (2 mg total) by mouth every 6 (six) hours as needed for muscle spasms.        Diagnostic Studies: Dg Chest 2 View  04/22/2015   CLINICAL DATA:  Preoperative evaluation for total knee replacement, former smoker  EXAM: CHEST  2 VIEW  COMPARISON:  None  FINDINGS: Normal heart size, mediastinal contours and pulmonary vascularity.  Central peribronchial thickening.  No pulmonary infiltrate, pleural effusion or pneumothorax.  Bones unremarkable.  IMPRESSION: Mild bronchitic changes without infiltrate.  Electronically Signed   By: Ulyses SouthwardMark  Boles M.D.   On: 04/22/2015 15:04    Disposition:       Discharge Instructions    CPM    Complete by:  As directed   Continuous passive motion machine (CPM):      Use the CPM from 0 to 60  for 5 hours per day.      You may increase by 10 degrees per day.  You may break it up into 2 or 3 sessions per day.      Use CPM for 2 weeks or until you are told to stop.     Call MD / Call 911    Complete by:  As directed   If you experience chest pain or shortness of breath, CALL 911 and be transported to the hospital emergency room.  If you develope a fever above 101 F, pus (white drainage) or increased drainage or redness at the wound, or calf pain, call your surgeon's office.     Change dressing    Complete by:  As directed   Change dressing on 5, then change the dressing daily with sterile 4  x 4 inch gauze dressing and apply TED hose.  You may clean the incision with alcohol prior to redressing.     Constipation Prevention    Complete by:  As directed   Drink plenty of fluids.  Prune juice may be helpful.  You may use a stool softener, such as Colace (over the counter) 100 mg twice a day.  Use MiraLax (over the counter) for constipation as needed.     Diet - low sodium heart healthy    Complete by:  As directed      Discharge instructions    Complete by:  As directed   Follow up in office with Dr. Turner Danielsowan in 2 weeks.     Driving restrictions    Complete by:  As directed   No driving for 2 weeks     Increase activity slowly as tolerated    Complete by:  As directed      Patient may shower    Complete by:  As directed   You may shower without a dressing once there is no drainage.  Do not wash over the wound.  If drainage remains, cover wound with plastic wrap and then shower.           Follow-up Information    Follow up with Nestor LewandowskyOWAN,FRANK J, MD In 2 weeks.   Specialty:  Orthopedic Surgery   Contact information:   [address] [place] [id number] [phone]        Signed: Vear ClockHILLIPS, ERIC R 05/13/2015, 8:14 AM

## 2015-05-13 NOTE — Progress Notes (Signed)
D/c packet and Rx's reviewed, questions answered. Denies pain. IV access removed. Wife has personal belongings and leaving with patient. Volunteers called to assist [place] to meet wife and wife will drive him home.

## 2015-06-19 ENCOUNTER — Other Ambulatory Visit (HOSPITAL_COMMUNITY): Payer: Self-pay | Admitting: Orthopedic Surgery

## 2015-06-19 ENCOUNTER — Ambulatory Visit (HOSPITAL_COMMUNITY)
Admission: RE | Admit: 2015-06-19 | Discharge: 2015-06-19 | Disposition: A | Discharge status: Home / Self Care | Payer: Medicare HMO | Source: Ambulatory Visit | Attending: Orthopedic Surgery | Admitting: Orthopedic Surgery

## 2015-06-19 DIAGNOSIS — Z96652 Presence of left artificial knee joint: Secondary | ICD-10-CM | POA: Insufficient documentation

## 2015-06-19 DIAGNOSIS — M7989 Other specified soft tissue disorders: Secondary | ICD-10-CM | POA: Insufficient documentation

## 2015-06-19 DIAGNOSIS — R609 Edema, unspecified: Secondary | ICD-10-CM

## 2015-06-19 NOTE — Progress Notes (Signed)
*  PRELIMINARY RESULTS* Vascular Ultrasound Left lower extremity venous duplex has been completed.  Preliminary findings: No obvious DVT noted in visualized veins.   Called results to [facility].    Farrel Demark, RDMS, RVT  06/19/2015, 1:09 PM

## 2017-04-11 ENCOUNTER — Encounter (HOSPITAL_COMMUNITY): Payer: Self-pay | Admitting: Emergency Medicine

## 2017-04-11 ENCOUNTER — Ambulatory Visit (HOSPITAL_COMMUNITY)
Admission: EM | Admit: 2017-04-11 | Discharge: 2017-04-11 | Disposition: A | Discharge status: Home / Self Care | Payer: Medicare HMO | Attending: Family Medicine | Admitting: Family Medicine

## 2017-04-11 ENCOUNTER — Ambulatory Visit (INDEPENDENT_AMBULATORY_CARE_PROVIDER_SITE_OTHER): Payer: Medicare HMO

## 2017-04-11 DIAGNOSIS — S41102A Unspecified open wound of left upper arm, initial encounter: Secondary | ICD-10-CM

## 2017-04-11 DIAGNOSIS — H10022 Other mucopurulent conjunctivitis, left eye: Secondary | ICD-10-CM

## 2017-04-11 DIAGNOSIS — Z23 Encounter for immunization: Secondary | ICD-10-CM | POA: Diagnosis not present

## 2017-04-11 DIAGNOSIS — H1032 Unspecified acute conjunctivitis, left eye: Secondary | ICD-10-CM

## 2017-04-11 DIAGNOSIS — M79642 Pain in left hand: Secondary | ICD-10-CM

## 2017-04-11 DIAGNOSIS — S60222A Contusion of left hand, initial encounter: Secondary | ICD-10-CM | POA: Diagnosis not present

## 2017-04-11 DIAGNOSIS — S41112A Laceration without foreign body of left upper arm, initial encounter: Secondary | ICD-10-CM

## 2017-04-11 MED ORDER — POLYMYXIN B-TRIMETHOPRIM 10000-0.1 UNIT/ML-% OP SOLN: 1.0000 [drp] | OPHTHALMIC | 0 refills | Status: AC

## 2017-04-11 MED ORDER — TETANUS-DIPHTH-ACELL PERTUSSIS 5-2.5-18.5 LF-MCG/0.5 IM SUSP
INTRAMUSCULAR | Status: AC
  Filled 2017-04-11: qty 0.5

## 2017-04-11 MED ORDER — TETANUS-DIPHTH-ACELL PERTUSSIS 5-2.5-18.5 LF-MCG/0.5 IM SUSP
0.5000 mL | Freq: Once | INTRAMUSCULAR | Status: AC
  Administered 2017-04-11: 0.5 mL via INTRAMUSCULAR
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
## 2019-11-21 ENCOUNTER — Emergency Department (HOSPITAL_COMMUNITY): Payer: Medicare HMO

## 2019-11-21 ENCOUNTER — Other Ambulatory Visit: Payer: Self-pay

## 2019-11-21 ENCOUNTER — Inpatient Hospital Stay (HOSPITAL_COMMUNITY)
Admission: EM | Admit: 2019-11-21 | Discharge: 2019-12-23 | DRG: 208 | Disposition: E | Payer: Medicare HMO | Attending: Internal Medicine | Admitting: Internal Medicine

## 2019-11-21 ENCOUNTER — Encounter (HOSPITAL_COMMUNITY): Payer: Self-pay | Admitting: Emergency Medicine

## 2019-11-21 DIAGNOSIS — R451 Restlessness and agitation: Secondary | ICD-10-CM | POA: Diagnosis not present

## 2019-11-21 DIAGNOSIS — Z79899 Other long term (current) drug therapy: Secondary | ICD-10-CM

## 2019-11-21 DIAGNOSIS — J811 Chronic pulmonary edema: Secondary | ICD-10-CM | POA: Diagnosis present

## 2019-11-21 DIAGNOSIS — J9601 Acute respiratory failure with hypoxia: Secondary | ICD-10-CM | POA: Diagnosis present

## 2019-11-21 DIAGNOSIS — Z515 Encounter for palliative care: Secondary | ICD-10-CM | POA: Diagnosis not present

## 2019-11-21 DIAGNOSIS — J1282 Pneumonia due to coronavirus disease 2019: Secondary | ICD-10-CM | POA: Diagnosis present

## 2019-11-21 DIAGNOSIS — Z66 Do not resuscitate: Secondary | ICD-10-CM | POA: Diagnosis not present

## 2019-11-21 DIAGNOSIS — N179 Acute kidney failure, unspecified: Secondary | ICD-10-CM | POA: Diagnosis not present

## 2019-11-21 DIAGNOSIS — R001 Bradycardia, unspecified: Secondary | ICD-10-CM | POA: Diagnosis not present

## 2019-11-21 DIAGNOSIS — E785 Hyperlipidemia, unspecified: Secondary | ICD-10-CM | POA: Diagnosis present

## 2019-11-21 DIAGNOSIS — U071 COVID-19: Principal | ICD-10-CM | POA: Diagnosis present

## 2019-11-21 DIAGNOSIS — J80 Acute respiratory distress syndrome: Secondary | ICD-10-CM | POA: Diagnosis not present

## 2019-11-21 DIAGNOSIS — I2510 Atherosclerotic heart disease of native coronary artery without angina pectoris: Secondary | ICD-10-CM | POA: Diagnosis present

## 2019-11-21 DIAGNOSIS — I2699 Other pulmonary embolism without acute cor pulmonale: Secondary | ICD-10-CM

## 2019-11-21 DIAGNOSIS — I10 Essential (primary) hypertension: Secondary | ICD-10-CM | POA: Diagnosis present

## 2019-11-21 DIAGNOSIS — J9600 Acute respiratory failure, unspecified whether with hypoxia or hypercapnia: Secondary | ICD-10-CM

## 2019-11-21 DIAGNOSIS — R579 Shock, unspecified: Secondary | ICD-10-CM

## 2019-11-21 DIAGNOSIS — I472 Ventricular tachycardia: Secondary | ICD-10-CM | POA: Diagnosis not present

## 2019-11-21 DIAGNOSIS — E872 Acidosis, unspecified: Secondary | ICD-10-CM

## 2019-11-21 DIAGNOSIS — Z9911 Dependence on respirator [ventilator] status: Secondary | ICD-10-CM | POA: Diagnosis not present

## 2019-11-21 DIAGNOSIS — I50810 Right heart failure, unspecified: Secondary | ICD-10-CM

## 2019-11-21 DIAGNOSIS — Z96653 Presence of artificial knee joint, bilateral: Secondary | ICD-10-CM | POA: Diagnosis present

## 2019-11-21 DIAGNOSIS — I4891 Unspecified atrial fibrillation: Secondary | ICD-10-CM | POA: Diagnosis not present

## 2019-11-21 DIAGNOSIS — R7989 Other specified abnormal findings of blood chemistry: Secondary | ICD-10-CM | POA: Diagnosis not present

## 2019-11-21 DIAGNOSIS — N400 Enlarged prostate without lower urinary tract symptoms: Secondary | ICD-10-CM | POA: Diagnosis present

## 2019-11-21 DIAGNOSIS — R0902 Hypoxemia: Secondary | ICD-10-CM

## 2019-11-21 DIAGNOSIS — R17 Unspecified jaundice: Secondary | ICD-10-CM

## 2019-11-21 DIAGNOSIS — R7401 Elevation of levels of liver transaminase levels: Secondary | ICD-10-CM

## 2019-11-21 DIAGNOSIS — I2609 Other pulmonary embolism with acute cor pulmonale: Secondary | ICD-10-CM

## 2019-11-21 DIAGNOSIS — I50811 Acute right heart failure: Secondary | ICD-10-CM | POA: Diagnosis not present

## 2019-11-21 DIAGNOSIS — R0602 Shortness of breath: Secondary | ICD-10-CM

## 2019-11-21 DIAGNOSIS — Z978 Presence of other specified devices: Secondary | ICD-10-CM

## 2019-11-21 DIAGNOSIS — R570 Cardiogenic shock: Secondary | ICD-10-CM | POA: Diagnosis not present

## 2019-11-21 DIAGNOSIS — Z87891 Personal history of nicotine dependence: Secondary | ICD-10-CM

## 2019-11-21 DIAGNOSIS — Z7982 Long term (current) use of aspirin: Secondary | ICD-10-CM

## 2019-11-21 DIAGNOSIS — E8729 Other acidosis: Secondary | ICD-10-CM

## 2019-11-21 LAB — CBC WITH DIFFERENTIAL/PLATELET
Abs Immature Granulocytes: 0.05 10*3/uL (ref 0.00–0.07)
Basophils Absolute: 0 10*3/uL (ref 0.0–0.1)
Basophils Relative: 0 %
Eosinophils Absolute: 0 10*3/uL (ref 0.0–0.5)
Eosinophils Relative: 0 %
HCT: 41.7 % (ref 39.0–52.0)
Hemoglobin: 14.2 g/dL (ref 13.0–17.0)
Immature Granulocytes: 1 %
Lymphocytes Relative: 7 %
Lymphs Abs: 0.7 10*3/uL (ref 0.7–4.0)
MCH: 31.3 pg (ref 26.0–34.0)
MCHC: 34.1 g/dL (ref 30.0–36.0)
MCV: 91.9 fL (ref 80.0–100.0)
Monocytes Absolute: 0.7 10*3/uL (ref 0.1–1.0)
Monocytes Relative: 8 %
Neutro Abs: 7.5 10*3/uL (ref 1.7–7.7)
Neutrophils Relative %: 84 %
Platelets: 184 10*3/uL (ref 150–400)
RBC: 4.54 MIL/uL (ref 4.22–5.81)
RDW: 12.8 % (ref 11.5–15.5)
WBC: 8.9 10*3/uL (ref 4.0–10.5)
nRBC: 0 % (ref 0.0–0.2)

## 2019-11-21 LAB — COMPREHENSIVE METABOLIC PANEL
ALT: 30 U/L (ref 0–44)
AST: 58 U/L — ABNORMAL HIGH (ref 15–41)
Albumin: 2.8 g/dL — ABNORMAL LOW (ref 3.5–5.0)
Alkaline Phosphatase: 47 U/L (ref 38–126)
Anion gap: 12 (ref 5–15)
BUN: 10 mg/dL (ref 8–23)
CO2: 21 mmol/L — ABNORMAL LOW (ref 22–32)
Calcium: 8.4 mg/dL — ABNORMAL LOW (ref 8.9–10.3)
Chloride: 99 mmol/L (ref 98–111)
Creatinine, Ser: 0.99 mg/dL (ref 0.61–1.24)
GFR calc Af Amer: >60 mL/min (ref >60)
GFR calc non Af Amer: >60 mL/min (ref >60)
Glucose, Bld: 154 mg/dL — ABNORMAL HIGH (ref 70–99)
Potassium: 3.8 mmol/L (ref 3.5–5.1)
Sodium: 132 mmol/L — ABNORMAL LOW (ref 135–145)
Total Bilirubin: 1.3 mg/dL — ABNORMAL HIGH (ref 0.3–1.2)
Total Protein: 6.5 g/dL (ref 6.5–8.1)

## 2019-11-21 LAB — LACTIC ACID, PLASMA
Lactic Acid, Venous: 2.5 mmol/L (ref 0.5–1.9)
Lactic Acid, Venous: 1.7 mmol/L (ref 0.5–1.9)

## 2019-11-21 LAB — FIBRINOGEN: Fibrinogen: >800 mg/dL — ABNORMAL HIGH (ref 210–475)

## 2019-11-21 LAB — POC SARS CORONAVIRUS 2 AG -  ED: SARS Coronavirus 2 Ag: NEGATIVE

## 2019-11-21 LAB — RESPIRATORY PANEL BY RT PCR (FLU A&B, COVID)
Influenza A by PCR: NEGATIVE
Influenza B by PCR: NEGATIVE
SARS Coronavirus 2 by RT PCR: POSITIVE — AB

## 2019-11-21 LAB — C-REACTIVE PROTEIN: CRP: 22.5 mg/dL — ABNORMAL HIGH (ref <1.0)

## 2019-11-21 LAB — FERRITIN: Ferritin: 1096 ng/mL — ABNORMAL HIGH (ref 24–336)

## 2019-11-21 LAB — TRIGLYCERIDES: Triglycerides: 96 mg/dL (ref <150)

## 2019-11-21 LAB — LACTATE DEHYDROGENASE: LDH: 616 U/L — ABNORMAL HIGH (ref 98–192)

## 2019-11-21 LAB — PROCALCITONIN: Procalcitonin: 0.22 ng/mL

## 2019-11-21 LAB — D-DIMER, QUANTITATIVE: D-Dimer, Quant: 3.06 ug/mL-FEU — ABNORMAL HIGH (ref 0.00–0.50)

## 2019-11-21 MED ORDER — DEXAMETHASONE SODIUM PHOSPHATE 10 MG/ML IJ SOLN
6.0000 mg | INTRAMUSCULAR | Status: DC
  Administered 2019-11-22 – 2019-11-24 (×3): 6 mg via INTRAVENOUS
  Filled 2019-11-21 (×3): qty 1

## 2019-11-21 MED ORDER — SODIUM CHLORIDE 0.9 % IV SOLN
100.0000 mg | Freq: Every day | INTRAVENOUS | Status: AC
  Administered 2019-11-22 – 2019-11-25 (×4): 100 mg via INTRAVENOUS
  Filled 2019-11-21 (×4): qty 20

## 2019-11-21 MED ORDER — ASPIRIN EC 81 MG PO TBEC
81.0000 mg | DELAYED_RELEASE_TABLET | Freq: Every day | ORAL | Status: DC
  Administered 2019-11-22: 81 mg via ORAL
  Filled 2019-11-21 (×2): qty 1

## 2019-11-21 MED ORDER — SODIUM CHLORIDE 0.9 % IV SOLN
200.0000 mg | Freq: Once | INTRAVENOUS | Status: AC
  Administered 2019-11-21: 200 mg via INTRAVENOUS
  Filled 2019-11-21: qty 200

## 2019-11-21 MED ORDER — ENOXAPARIN SODIUM 40 MG/0.4ML ~~LOC~~ SOLN
40.0000 mg | SUBCUTANEOUS | Status: DC
  Administered 2019-11-21: 40 mg via SUBCUTANEOUS
  Filled 2019-11-21: qty 0.4

## 2019-11-21 MED ORDER — DEXAMETHASONE SODIUM PHOSPHATE 10 MG/ML IJ SOLN
10.0000 mg | Freq: Once | INTRAMUSCULAR | Status: AC
  Administered 2019-11-21: 10 mg via INTRAVENOUS
  Filled 2019-11-21: qty 1

## 2019-11-21 MED ORDER — IOHEXOL 350 MG/ML SOLN
100.0000 mL | Freq: Once | INTRAVENOUS | Status: AC | PRN
  Administered 2019-11-21: 100 mL via INTRAVENOUS

## 2019-11-21 MED ORDER — LACTATED RINGERS IV BOLUS
500.0000 mL | Freq: Once | INTRAVENOUS | Status: AC
  Administered 2019-11-21: 500 mL via INTRAVENOUS

## 2019-11-21 NOTE — ED Triage Notes (Signed)
Pt BIB [facility] from home, c/o shortness of breath and chest pain when coughing. Diagnosed with COVID on 1/22. On EMS arrival, pt 75% on room air, improved to 90% on simple mask @ 9L. On arrival to ED, pt 74% on room air, improved to 90% on 15L. Pt A&O x 4, denies pain at this time.

## 2019-11-21 NOTE — ED Provider Notes (Signed)
[facility] EMERGENCY DEPARTMENT Provider Note   CSN: [id number] Arrival date & time: 12/12/2019  1555     History Chief Complaint  Patient presents with  . Shortness of Breath    Matthew Mendez is a 79 y.o. male.  HPI 79 year old male with no past medical history presenting to the emergency department for shortness of breath and pleuritic chest pain.  Patient was diagnosed with Covid approximately 1 week ago, states that over the past 3 to 4 days he is experienced worsening pleuritic chest pain or shortness of breath.  Subjective fevers and chills at home as well.  EMS was called by his daughter today, who was worried about his breathing, on arrival EMS states that patient was satting 70% on room air, placed on a nonrebreather with improvement into the low 90s.  Patient was tachypneic in the 40s.  On arrival to the ED patient looked comfortable on a nonrebreather at 10 L, breathing around 30 times a minute, patient states that he only has chest pain when he takes a deep breath or coughs, denies any history of cardiac disease, no history of DVT or PE.  Patient states he was tested positive proxy 1 week ago.  No nausea or vomiting but patient states he is felt malaise and fatigue as well.  No cough, denies any hemoptysis or hematemesis, no melena or hematochezia.    Past Medical History:  Diagnosis Date  . Arthritis    right shoulder  . Urinary frequency     Patient Active Problem List   Diagnosis Date Noted  . Pneumonia due to COVID-19 virus 2019/12/12  . Transaminitis December 12, 2019  . Total bilirubin, elevated 12-12-19  . Respiratory failure, acute (HCC) Dec 12, 2019  . Arthritis of knee 05/11/2015    Past Surgical History:  Procedure Laterality Date  . APPENDECTOMY    . KNEE ARTHROPLASTY Right   . TONSILLECTOMY    . TOTAL KNEE ARTHROPLASTY Left 05/11/2015   Procedure: TOTAL KNEE ARTHROPLASTY;  Surgeon: Gean Birchwood, MD;  Location: [facility] OR;  Service:  Orthopedics;  Laterality: Left;  LEFT TOTAL KNEE ARTHROPLASTY       No family history on file.  Social History   Tobacco Use  . Smoking status: Former Smoker    Quit date: 01/23/1971    Years since quitting: 48.8  . Smokeless tobacco: Never Used  Substance Use Topics  . Alcohol use: No  . Drug use: No    Home Medications Prior to Admission medications   Medication Sig Start Date End Date Taking? Authorizing Provider  acetaminophen (TYLENOL) 500 MG tablet Take 1,000 mg by mouth every 6 (six) hours as needed for mild pain, fever or headache.   Yes [provider]  ascorbic acid (VITAMIN C) 500 MG tablet Take 500-1,000 mg by mouth daily.   Yes [provider]  aspirin 81 MG EC tablet Take 81 mg by mouth daily.   Yes [provider]  naproxen sodium (ALEVE) 220 MG tablet Take 220-440 mg by mouth 2 (two) times daily as needed (for arthritis pain).    Yes [provider]  Phenyleph-Doxylamine-DM-APAP (TYLENOL COLD/FLU/COUGH NIGHT) 5-6.25-10-325 MG/15ML LIQD Take 20 mLs by mouth every 4 (four) hours as needed (for flu-like symptoms).    Yes [provider]  trimethoprim-polymyxin b (POLYTRIM) ophthalmic solution Place 1 drop into the left eye every 4 (four) hours. Patient not taking: Reported on 2019/12/12 04/11/17   Deatra Canter, FNP    Allergies  Patient has no known allergies.  Review of Systems   Review of Systems  Unable to perform ROS: Acuity of condition    Physical Exam Updated Vital Signs BP 106/73   Pulse 87   Temp 99.7 F (37.6 C) (Oral)   Resp (!) 27   SpO2 91%   Physical Exam Vitals and nursing note reviewed.  Constitutional:      General: He is not in acute distress.    Appearance: Normal appearance. He is well-developed. He is ill-appearing. He is not toxic-appearing or diaphoretic.  HENT:     Head: Normocephalic and atraumatic.     Right Ear: External ear normal.     Left Ear: External ear normal.      Mouth/Throat:     Mouth: Mucous membranes are moist.     Pharynx: Oropharynx is clear.  Eyes:     Conjunctiva/sclera: Conjunctivae normal.  Cardiovascular:     Rate and Rhythm: Normal rate and regular rhythm.     Heart sounds: No murmur.  Pulmonary:     Effort: Pulmonary effort is normal. Tachypnea present. No respiratory distress.     Breath sounds: Examination of the right-lower field reveals rhonchi. Examination of the left-lower field reveals rhonchi. Rhonchi present. No decreased breath sounds, wheezing or rales.  Chest:     Chest wall: No mass or deformity.  Abdominal:     Palpations: Abdomen is soft.     Tenderness: There is no abdominal tenderness.  Musculoskeletal:        General: No swelling or tenderness. Normal range of motion.     Cervical back: Normal range of motion and neck supple. No rigidity.  Skin:    General: Skin is warm and dry.     Capillary Refill: Capillary refill takes less than 2 seconds.  Neurological:     General: No focal deficit present.     Mental Status: He is alert and oriented to person, place, and time. Mental status is at baseline.  Psychiatric:        Mood and Affect: Mood normal.        Behavior: Behavior normal.     ED Results / Procedures / Treatments   Labs (all labs ordered are listed, but only abnormal results are displayed) Labs Reviewed  RESPIRATORY PANEL BY RT PCR (FLU A&B, COVID) - Abnormal; Notable for the following components:      Result Value   SARS Coronavirus 2 by RT PCR POSITIVE (*)    All other components within normal limits  LACTIC ACID, PLASMA - Abnormal; Notable for the following components:   Lactic Acid, Venous 2.5 (*)    All other components within normal limits  COMPREHENSIVE METABOLIC PANEL - Abnormal; Notable for the following components:   Sodium 132 (*)    CO2 21 (*)    Glucose, Bld 154 (*)    Calcium 8.4 (*)    Albumin 2.8 (*)    AST 58 (*)    Total Bilirubin 1.3 (*)    All other components within  normal limits  D-DIMER, QUANTITATIVE (NOT AT [facility]) - Abnormal; Notable for the following components:   D-Dimer, Quant 3.06 (*)    All other components within normal limits  LACTATE DEHYDROGENASE - Abnormal; Notable for the following components:   LDH 616 (*)    All other components within normal limits  FERRITIN - Abnormal; Notable for the following components:   Ferritin 1,096 (*)    All other components within normal limits  FIBRINOGEN - Abnormal; Notable for the following components:   Fibrinogen >800 (*)    All other components within normal limits  C-REACTIVE PROTEIN - Abnormal; Notable for the following components:   CRP 22.5 (*)    All other components within normal limits  CULTURE, BLOOD (ROUTINE X 2)  CULTURE, BLOOD (ROUTINE X 2)  LACTIC ACID, PLASMA  CBC WITH DIFFERENTIAL/PLATELET  PROCALCITONIN  TRIGLYCERIDES  CBC WITH DIFFERENTIAL/PLATELET  COMPREHENSIVE METABOLIC PANEL  C-REACTIVE PROTEIN  FERRITIN  POC SARS CORONAVIRUS 2 AG -  ED    EKG EKG Interpretation  Date/Time:  Thursday November 21 2019 16:00:42 EST Ventricular Rate:  99 PR Interval:    QRS Duration: 101 QT Interval:  357 QTC Calculation: 459 R Axis:   -54 Text Interpretation: Sinus rhythm Inferolateral infarct, old No acute changes No significant change since last tracing Confirmed by Derwood Kaplan [phone]) on 11/22/2019 4:14:55 PM   Radiology CT Angio Chest PE W and/or Wo Contrast  Result Date: 11/20/2019 CLINICAL DATA:  Shortness of breath, COVID-19 positive EXAM: CT ANGIOGRAPHY CHEST WITH CONTRAST TECHNIQUE: Multidetector CT imaging of the chest was performed using the standard protocol during bolus administration of intravenous contrast. Multiplanar CT image reconstructions and MIPs were obtained to evaluate the vascular anatomy. CONTRAST:  OMNIPAQUE IOHEXOL 350 MG/ML SOLN COMPARISON:  Same day chest radiograph FINDINGS: Cardiovascular: Satisfactory opacification of the pulmonary arteries to  the segmental level. No evidence of pulmonary embolism. Normal heart size. No pericardial effusion. Thoracic aorta is nonaneurysmal. Atherosclerotic calcifications of the aorta and coronary arteries. Mediastinum/Nodes: Mediastinal lymphadenopathy. Reference nodes include AP window node measuring 19 mm short axis (series 4, image 39) and precarinal node measuring 17 mm (series 4, image 42). Mildly prominent bilateral hilar lymph nodes. No axillary lymphadenopathy. Unremarkable thyroid gland, trachea, and esophagus. Lungs/Pleura: Extensive ground-glass opacities throughout both lungs, largely in a peripheral distribution. No pleural effusion or pneumothorax. Upper Abdomen: No acute findings. Musculoskeletal: No chest wall abnormality. No acute or significant osseous findings. Review of the MIP images confirms the above findings. IMPRESSION: 1. No evidence of acute pulmonary embolism. 2. Extensive ground-glass opacities throughout both lungs, compatible with multifocal pneumonia in the setting of known COVID 19 infection. 3. Mediastinal lymphadenopathy, likely reactive. 4. Aortic and coronary artery atherosclerosis. Electronically Signed   By: Duanne Guess D.O.   On: 11/24/2019 20:00   DG Chest Port 1 View  Result Date: 10/25/2019 CLINICAL DATA:  Pt BIB [facility] from home, c/o shortness of breath and chest pain when coughing. Diagnosed with COVID on 1/22. On EMS arrival, pt 75% on room air, improved to 90% on simple mask @ 9L. On arrival to ED, pt 74% on room air, improved to 90% on 15L. Pt A&O x 4, denies pain at this time. EXAM: PORTABLE CHEST 1 VIEW COMPARISON:  04/22/2015 FINDINGS: There are bilateral hazy airspace opacities, most confluent in the right perihilar and medial upper lung zone, all new since the prior chest radiograph. No pleural effusion or pneumothorax. Cardiac silhouette is normal in size. No convincing mediastinal or hilar masses. Skeletal structures are grossly intact. IMPRESSION: 1.  Bilateral airspace lung opacities consistent with multifocal COVID-19 pneumonia. Electronically Signed   By: Amie Portland M.D.   On: 11/18/2019 16:23    Procedures Procedures (including critical care time)  Medications Ordered in ED Medications  remdesivir 200 mg in sodium chloride 0.9% 250 mL IVPB (200 mg Intravenous New Bag/Given 11/20/2019 2020)    Followed by  remdesivir 100 mg in  sodium chloride 0.9 % 100 mL IVPB (has no administration in time range)  enoxaparin (LOVENOX) injection 40 mg (has no administration in time range)  dexamethasone (DECADRON) injection 6 mg (has no administration in time range)  dexamethasone (DECADRON) injection 10 mg (10 mg Intravenous Given 12/05/2019 1857)  lactated ringers bolus 500 mL (0 mLs Intravenous Stopped 12/05/2019 2019)  iohexol (OMNIPAQUE) 350 MG/ML injection 100 mL (100 mLs Intravenous Contrast Given 12-05-2019 1933)    ED Course  I have reviewed the triage vital signs and the nursing notes.  Pertinent labs & imaging results that were available during my care of the patient were reviewed by me and considered in my medical decision making (see chart for details).    MDM Rules/Calculators/A&P                      79yo M presenting with SOB in the setting of known COVID. On arrival hypoxic in the 70's RA. Placed on NRB with improvement. Patient mildly tachypneic but otherwise speakin in complete sentences, no signs of resp failure. Likely COVID PNA contributing, however PE is also on the differential given patient's degree of hypoxia relative to his clear lungs.  Patient does have some mild rhonchi but otherwise patient moving good air.  Doubt COPD or CHF.  Chest x-ray with multifocal opacities consistent with Covid pneumonia, given Decadron and roomed in severe, patient continued on 10 L nonrebreather, showing no signs of respiratory failure at this time.  Patient afebrile, labs as well as CTA of chest was obtained.  Patient on admission secondary to new  oxygen requirement. Lactic acid elevated but otherwise labs reassuring, given 500cc LR bolus.   Patient lactic improving, CTA negative. Admitted in good condition.   The attending physician was present and available for all medical decision making and procedures related to this patient's care.     Final Clinical Impression(s) / ED Diagnoses Final diagnoses:  Hypoxia  Shortness of breath    Rx / DC Orders ED Discharge Orders    None       Kizzie Fantasia, MD 05-Dec-2019 2024    Varney Biles, MD 11/22/19 1635

## 2019-11-21 NOTE — H&P (Signed)
History and Physical    Matthew Mendez WUJ:[id number] DOB: 23-Feb-1941 DOA: December 04, 2019  PCP: Wayland Salinas, MD  Patient coming from: home I have personally briefly reviewed patient's old medical records in [place]  Chief Complaint: shortness of breath   HPI: Matthew Mendez is a 79 y.o. male with medical history significant of with HLD, BPH who presents with concerns of hypoxia.  Pt was diagnosed with COVID about a week ago on 1/22. Wife also positive. He had several days of headache and fever up to 102. Notes decrease appetite. Had mild cough but no shortness of breath. No chest pain. No nausea, vomiting or diarrhea.   On EMS arrival he was satting at 70% on room air and placed on NRB. In the ER, he had a temperature of 99.68F, borderline blood pressure and was tachypneic requiring NRB 15L.  CBC was unremarkable. Na of 132, glucose of 154, AST of 58, ALT of 30, Total bilirubin of 1.3  CRP of 22.5, lactate of 2.5, procalcitonin of 0.22 D-dimer of 3-CTA chest showed no pulmonary embolism and extensive ground-glass opacities throughout.  CXR consistent with multifocal viral pneumonia.   He was started on decadron, remdesivir and given 500cc LR bolus.    Review of Systems:  Constitutional: No Weight Change, + Fever ENT/Mouth: No sore throat, No Rhinorrhea Eyes: No Vision Changes Cardiovascular: No Chest Pain, no SOB Respiratory: + Cough, No Sputum, No Wheezing, no Dyspnea  Gastrointestinal: No Nausea, No Vomiting, No Diarrhea, No Constipation, No Pain Genitourinary: no Urinary Incontinence Musculoskeletal: No Arthralgias, No Myalgias Skin: No Skin Lesions, No Pruritus, Neuro: no Weakness, No Numbness Psych: No Anxiety/Panic, No Depression, no decrease appetite Heme/Lymph: No Bruising, No Bleeding  Past Medical History:  Diagnosis Date  . Arthritis    right shoulder  . Urinary frequency     Past Surgical History:  Procedure Laterality Date  .  APPENDECTOMY    . KNEE ARTHROPLASTY Right   . TONSILLECTOMY    . 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 He has never used smokeless tobacco. He reports that he does not drink alcohol or use drugs.  No Known Allergies    Prior to Admission medications   Medication Sig Start Date End Date Taking? Authorizing Provider  acetaminophen (TYLENOL) 500 MG tablet Take 1,000 mg by mouth every 6 (six) hours as needed for mild pain, fever or headache.   Yes [provider]  ascorbic acid (VITAMIN C) 500 MG tablet Take 500-1,000 mg by mouth daily.   Yes [provider]  aspirin 81 MG EC tablet Take 81 mg by mouth daily.   Yes [provider]  naproxen sodium (ALEVE) 220 MG tablet Take 220-440 mg by mouth 2 (two) times daily as needed (for arthritis pain).    Yes [provider]  Phenyleph-Doxylamine-DM-APAP (TYLENOL COLD/FLU/COUGH NIGHT) 5-6.25-10-325 MG/15ML LIQD Take 20 mLs by mouth every 4 (four) hours as needed (for flu-like symptoms).    Yes [provider]  trimethoprim-polymyxin b (POLYTRIM) ophthalmic solution Place 1 drop into the left eye every 4 (four) hours. Patient not taking: Reported on 12/04/2019 04/11/17   Lysbeth Penner, FNP    Physical Exam: Vitals:   2019/12/04 1715 12-04-19 1730 Dec 04, 2019 1853 12-04-19 1855  BP: 110/65  107/67 116/68   Pulse: 89 85 92 88  Resp: (!) 33 (!) 32 (!) 30 (!) 27  Temp:      TempSrc:      SpO2: 94% 93%  91%    Constitutional: NAD, calm, comfortable, non-toxic appearing male asleep in bed Vitals:   11/11/2019 1715 10/28/2019 1730 11/20/2019 1853 11/20/2019 1855  BP: 110/65 107/67 116/68   Pulse: 89 85 92 88  Resp: (!) 33 (!) 32 (!) 30 (!) 27  Temp:      TempSrc:      SpO2: 94% 93%  91%   Eyes: PERRL,  lids and conjunctivae normal ENMT: Mucous membranes are moist.  Neck: normal, supple Respiratory: bibasilar crackles,  no wheezing. Normal respiratory effort on 15L NRB. No accessory muscle use.  Cardiovascular: Regular rate and rhythm, no murmurs / rubs / gallops. No extremity edema. Abdomen: no tenderness, no masses palpated.  Musculoskeletal: no clubbing / cyanosis. No joint deformity upper and lower extremities. Good ROM, no contractures. Normal muscle tone.  Skin: no rashes, lesions, ulcers. No induration Neurologic: CN 2-12 grossly intact. Sensation intact. Strength 5/5 in all 4.  Psychiatric: Normal judgment and insight. Alert and oriented x 3. Normal mood.     Labs on Admission: I have personally reviewed following labs and imaging studies  CBC: Recent Labs  Lab 11/19/2019 1608  WBC 8.9  NEUTROABS 7.5  HGB 14.2  HCT 41.7  MCV 91.9  PLT 184   Basic Metabolic Panel: Recent Labs  Lab 11/16/2019 1608  NA 132*  K 3.8  CL 99  CO2 21*  GLUCOSE 154*  BUN 10  CREATININE 0.99  CALCIUM 8.4*   GFR: CrCl cannot be calculated (Unknown ideal weight.). Liver Function Tests: Recent Labs  Lab 11/09/2019 1608  AST 58*  ALT 30  ALKPHOS 47  BILITOT 1.3*  PROT 6.5  ALBUMIN 2.8*   No results for input(s): LIPASE, AMYLASE in the last 168 hours. No results for input(s): AMMONIA in the last 168 hours. Coagulation Profile: No results for input(s): INR, PROTIME in the last 168 hours. Cardiac Enzymes: No results for input(s): CKTOTAL, CKMB, CKMBINDEX, TROPONINI in the last 168 hours. BNP (last 3 results) No results for input(s): PROBNP in the last 8760 hours. HbA1C: No results for input(s): HGBA1C in the last 72 hours. CBG: No results for input(s): GLUCAP in the last 168 hours. Lipid Profile: Recent Labs    11/10/2019 1608  TRIG 96   Thyroid Function Tests: No results for input(s): TSH, T4TOTAL, FREET4, T3FREE, THYROIDAB in the last 72 hours. Anemia Panel: Recent Labs     11/23/2019 1608  FERRITIN 1,096*   Urine analysis:    Component Value Date/Time   COLORURINE YELLOW 04/29/2015 0821   APPEARANCEUR CLOUDY (A) 04/29/2015 0821   LABSPEC 1.025 04/29/2015 0821   PHURINE 5.0 04/29/2015 0821   GLUCOSEU NEGATIVE 04/29/2015 0821   HGBUR TRACE (A) 04/29/2015 0821   BILIRUBINUR NEGATIVE 04/29/2015 0821   KETONESUR NEGATIVE 04/29/2015 0821   PROTEINUR NEGATIVE 04/29/2015 0821   UROBILINOGEN 0.2 04/29/2015 0821   NITRITE NEGATIVE 04/29/2015 0821   LEUKOCYTESUR NEGATIVE 04/29/2015 0821    Radiological Exams on Admission: DG Chest Port 1 View  Result Date: 11/17/2019 CLINICAL DATA:  Pt BIB [facility] from home, c/o shortness of breath and chest pain when coughing. Diagnosed with COVID on 1/22. On EMS arrival, pt 75% on room air, improved to 90% on simple mask @ 9L. On arrival to ED, pt 74% on room  air, improved to 90% on 15L. Pt A&O x 4, denies pain at this time. EXAM: PORTABLE CHEST 1 VIEW COMPARISON:  04/22/2015 FINDINGS: There are bilateral hazy airspace opacities, most confluent in the right perihilar and medial upper lung zone, all new since the prior chest radiograph. No pleural effusion or pneumothorax. Cardiac silhouette is normal in size. No convincing mediastinal or hilar masses. Skeletal structures are grossly intact. IMPRESSION: 1. Bilateral airspace lung opacities consistent with multifocal COVID-19 pneumonia. Electronically Signed   By: Amie Portland M.D.   On: 12-08-19 16:23    EKG: Independently reviewed.   Assessment/Plan  Acute hypoxic respiratory failure secondary to COVID pneumonia On 15L NRB Maintain O2 > 92%  IV Decadron  Remdesivir CPR up to 22- Pt does not have hx of malignancy, TB, or hepatitis. Benefits and risk of Actemra discussed with patient but he declines at this time.  Monitor inflammatory markers  Transaminitis/Elevated T.bilirubin Reactive to COVID infection. Monitor AST of 58, ALT of 30, Total bilirubin of 1.3   DVT  prophylaxis:.Lovenox Code Status: Full Family Communication: Plan discussed with patient at bedside  disposition Plan: Home with at least 2 midnight stays  Consults called:  Admission status: inpatient    Omnia Dollinger T Aikam Hellickson DO [facility]   If 7PM-7AM, please contact night-coverage [email or web address]   Dec 08, 2019, 7:35 PM

## 2019-11-22 ENCOUNTER — Inpatient Hospital Stay (HOSPITAL_COMMUNITY): Payer: Medicare HMO

## 2019-11-22 LAB — FERRITIN: Ferritin: 1324 ng/mL — ABNORMAL HIGH (ref 24–336)

## 2019-11-22 LAB — COMPREHENSIVE METABOLIC PANEL
ALT: 30 U/L (ref 0–44)
AST: 42 U/L — ABNORMAL HIGH (ref 15–41)
Albumin: 2.6 g/dL — ABNORMAL LOW (ref 3.5–5.0)
Alkaline Phosphatase: 49 U/L (ref 38–126)
Anion gap: 12 (ref 5–15)
BUN: 10 mg/dL (ref 8–23)
CO2: 23 mmol/L (ref 22–32)
Calcium: 8.8 mg/dL — ABNORMAL LOW (ref 8.9–10.3)
Chloride: 101 mmol/L (ref 98–111)
Creatinine, Ser: 0.77 mg/dL (ref 0.61–1.24)
GFR calc Af Amer: >60 mL/min (ref >60)
GFR calc non Af Amer: >60 mL/min (ref >60)
Glucose, Bld: 153 mg/dL — ABNORMAL HIGH (ref 70–99)
Potassium: 4.2 mmol/L (ref 3.5–5.1)
Sodium: 136 mmol/L (ref 135–145)
Total Bilirubin: 0.5 mg/dL (ref 0.3–1.2)
Total Protein: 6.7 g/dL (ref 6.5–8.1)

## 2019-11-22 LAB — CBC WITH DIFFERENTIAL/PLATELET
Abs Immature Granulocytes: 0.04 10*3/uL (ref 0.00–0.07)
Basophils Absolute: 0 10*3/uL (ref 0.0–0.1)
Basophils Relative: 1 %
Eosinophils Absolute: 0 10*3/uL (ref 0.0–0.5)
Eosinophils Relative: 0 %
HCT: 43.7 % (ref 39.0–52.0)
Hemoglobin: 14.8 g/dL (ref 13.0–17.0)
Immature Granulocytes: 1 %
Lymphocytes Relative: 9 %
Lymphs Abs: 0.7 10*3/uL (ref 0.7–4.0)
MCH: 31.6 pg (ref 26.0–34.0)
MCHC: 33.9 g/dL (ref 30.0–36.0)
MCV: 93.2 fL (ref 80.0–100.0)
Monocytes Absolute: 0.4 10*3/uL (ref 0.1–1.0)
Monocytes Relative: 5 %
Neutro Abs: 6.7 10*3/uL (ref 1.7–7.7)
Neutrophils Relative %: 84 %
Platelets: 171 10*3/uL (ref 150–400)
RBC: 4.69 MIL/uL (ref 4.22–5.81)
RDW: 12.9 % (ref 11.5–15.5)
WBC: 7.9 10*3/uL (ref 4.0–10.5)
nRBC: 0 % (ref 0.0–0.2)

## 2019-11-22 LAB — MAGNESIUM: Magnesium: 2.4 mg/dL (ref 1.7–2.4)

## 2019-11-22 LAB — C-REACTIVE PROTEIN: CRP: 25.4 mg/dL — ABNORMAL HIGH (ref <1.0)

## 2019-11-22 MED ORDER — METOPROLOL TARTRATE 5 MG/5ML IV SOLN
INTRAVENOUS | Status: AC
  Filled 2019-11-22: qty 5

## 2019-11-22 MED ORDER — TOCILIZUMAB 400 MG/20ML IV SOLN
800.0000 mg | Freq: Once | INTRAVENOUS | Status: AC
  Administered 2019-11-22: 800 mg via INTRAVENOUS
  Filled 2019-11-22: qty 40

## 2019-11-22 MED ORDER — AMIODARONE HCL IN DEXTROSE 360-4.14 MG/200ML-% IV SOLN
60.0000 mg/h | INTRAVENOUS | Status: DC
  Filled 2019-11-22: qty 200

## 2019-11-22 MED ORDER — ENOXAPARIN SODIUM 60 MG/0.6ML ~~LOC~~ SOLN
50.0000 mg | SUBCUTANEOUS | Status: DC
  Administered 2019-11-22: 50 mg via SUBCUTANEOUS
  Filled 2019-11-22: qty 0.6

## 2019-11-22 MED ORDER — ASCORBIC ACID 500 MG PO TABS
500.0000 mg | ORAL_TABLET | Freq: Every day | ORAL | Status: DC
  Administered 2019-11-22: 500 mg via ORAL
  Filled 2019-11-22 (×2): qty 1

## 2019-11-22 MED ORDER — MORPHINE SULFATE (PF) 2 MG/ML IV SOLN
INTRAVENOUS | Status: AC
  Administered 2019-11-22: 0.25 mg via INTRAVENOUS
  Filled 2019-11-22: qty 1

## 2019-11-22 MED ORDER — AMIODARONE HCL IN DEXTROSE 360-4.14 MG/200ML-% IV SOLN
30.0000 mg/h | INTRAVENOUS | Status: DC
  Filled 2019-11-22 (×2): qty 200

## 2019-11-22 MED ORDER — MORPHINE SULFATE (PF) 2 MG/ML IV SOLN
INTRAVENOUS | Status: AC
  Filled 2019-11-22: qty 1

## 2019-11-22 MED ORDER — DILTIAZEM HCL 25 MG/5ML IV SOLN
10.0000 mg | Freq: Once | INTRAVENOUS | Status: AC
  Administered 2019-11-22: 10 mg via INTRAVENOUS
  Filled 2019-11-22: qty 5

## 2019-11-22 MED ORDER — METOPROLOL TARTRATE 5 MG/5ML IV SOLN: 5.0000 mg | INTRAVENOUS | Status: AC

## 2019-11-22 MED ORDER — MORPHINE SULFATE (PF) 2 MG/ML IV SOLN: 0.5000 mg | INTRAVENOUS | Status: AC

## 2019-11-22 MED ORDER — DILTIAZEM HCL 25 MG/5ML IV SOLN
10.0000 mg | Freq: Once | INTRAVENOUS | Status: AC
  Administered 2019-11-22: 10 mg via INTRAVENOUS

## 2019-11-22 NOTE — Progress Notes (Signed)
1755 called to pt room pt was confused attempting to get oob nonrebreathier off SpO2 was in the 50s pt redirected easily assisted pt back on the bed correctly and nonrebreather replaced, MD called RT called and RRT called when pt did not improve quickly HR elevated to 190s see orders for medication orders.  Pt received 2 doses of IV Cardizem  givne and pt converted to NSR at 1905. Curretnly on 100% heated high flow and 15l via non rebreather and SpO2 ranging between 88-90%

## 2019-11-22 NOTE — Progress Notes (Signed)
PROGRESS NOTE    Matthew Mendez  XNA:[id number] DOB: Feb 24, 1941 DOA: 11/06/2019 PCP: Wayland Salinas, MD    Brief Narrative:78 y.o. male with medical history significant of with HLD, BPH who presents with concerns of hypoxia.  Pt was diagnosed with COVID about a week ago on 1/22. Wife also positive. He had several days of headache and fever up to 102. Notes decrease appetite. Had mild cough but no shortness of breath. No chest pain. No nausea, vomiting or diarrhea.   On EMS arrival he was satting at 70% on room air and placed on NRB. In the ER, he had a temperature of 99.6F, borderline blood pressure and was tachypneic requiring NRB 15L.  CBC was unremarkable. Na of 132, glucose of 154, AST of 58, ALT of 30, Total bilirubin of 1.3  CRP of 22.5, lactate of 2.5, procalcitonin of 0.22 D-dimer of 3-CTA chest showed no pulmonary embolism and extensive ground-glass opacities throughout.  CXR consistent with multifocal viral pneumonia.   He was started on decadron, remdesivir and given 500cc LR bolus  Assessment & Plan:   Principal Problem:   Pneumonia due to COVID-19 virus Active Problems:   Transaminitis   Total bilirubin, elevated   Respiratory failure, acute ([place])   #1 acute hypoxic respiratory failure secondary to Covid pneumonia-chest x-ray consistent with bilateral groundglass opacities. CRP 25.4, ferritin 1324, normal white count, normal procalcitonin. Patient reports dyspnea on exertion He remains on 15 L of oxygen saturation above 90%. Continue Decadron and remdesivir Add Actemra discussed with patient and his wife and he is willing to take it. Vitamin C vitamin D3  #2 history of BPH no complaints stable  #3 history of hyperlipidemia not on any medications at home.    Estimated body mass index is 37.11 kg/m as calculated from the following:   Height as of this encounter: 5\' 8"  (1.727 m).   Weight as of this encounter: 110.7 kg.  DVT prophylaxis:  Lovenox Code Status full code  family Communication: Discussed with patient's wife on the phone  disposition Plan: Pending clinical improvement.  Patient came from home hopefully he can be discharged back home with home health once he is stable.  Barriers to discharge he is on high flow nasal cannula 15 L and is hypoxic. Consultants:   None  Procedures: None Antimicrobials: None  Subjective: Resting in bed in no acute distress he denies any shortness of breath at rest however he is very dyspneic on exertion.  Objective: Vitals:   11/22/19 0400 11/22/19 0448 11/22/19 0500 11/22/19 0600  BP:  129/79    Pulse: 62 76 76   Resp: (!) 22 (!) 38 (!) 31   Temp:  97.8 F (36.6 C)    TempSrc:  Oral    SpO2: 92% 90% 90% 90%  Weight:      Height:        Intake/Output Summary (Last 24 hours) at 11/22/2019 1113 Last data filed at 11/22/2019 0451 Gross per 24 hour  Intake --  Output 1600 ml  Net -1600 ml   Filed Weights   11/16/2019 2040  Weight: 110.7 kg    Examination:  General exam: Appears calm and comfortable  Respiratory system: coarse  to auscultation. Respiratory effort normal. Cardiovascular system: S1 & S2 heard, RRR. No JVD, murmurs, rubs, gallops or clicks. No pedal edema. Gastrointestinal system: Abdomen is nondistended, soft and nontender. No organomegaly or masses felt. Normal bowel sounds heard. Central nervous system: Alert and oriented. No focal neurological  deficits. Extremities: Symmetric 5 x 5 power. Skin: No rashes, lesions or ulcers Psychiatry: Judgement and insight appear normal. Mood & affect appropriate.     Data Reviewed: I have personally reviewed following labs and imaging studies  CBC: Recent Labs  Lab 11/11/2019 1608 11/22/19 0853  WBC 8.9 7.9  NEUTROABS 7.5 6.7  HGB 14.2 14.8  HCT 41.7 43.7  MCV 91.9 93.2  PLT 184 171   Basic Metabolic Panel: Recent Labs  Lab 10/27/2019 1608 11/22/19 0853  NA 132* 136  K 3.8 4.2  CL 99 101  CO2 21* 23   GLUCOSE 154* 153*  BUN 10 10  CREATININE 0.99 0.77  CALCIUM 8.4* 8.8*   GFR: Estimated Creatinine Clearance: 93.3 mL/min (by C-G formula based on SCr of 0.77 mg/dL). Liver Function Tests: Recent Labs  Lab 11/04/2019 1608 11/22/19 0853  AST 58* 42*  ALT 30 30  ALKPHOS 47 49  BILITOT 1.3* 0.5  PROT 6.5 6.7  ALBUMIN 2.8* 2.6*   No results for input(s): LIPASE, AMYLASE in the last 168 hours. No results for input(s): AMMONIA in the last 168 hours. Coagulation Profile: No results for input(s): INR, PROTIME in the last 168 hours. Cardiac Enzymes: No results for input(s): CKTOTAL, CKMB, CKMBINDEX, TROPONINI in the last 168 hours. BNP (last 3 results) No results for input(s): PROBNP in the last 8760 hours. HbA1C: No results for input(s): HGBA1C in the last 72 hours. CBG: No results for input(s): GLUCAP in the last 168 hours. Lipid Profile: Recent Labs    11/01/2019 1608  TRIG 96   Thyroid Function Tests: No results for input(s): TSH, T4TOTAL, FREET4, T3FREE, THYROIDAB in the last 72 hours. Anemia Panel: Recent Labs    11/20/2019 1608 11/22/19 0853  FERRITIN 1,096* 1,324*   Sepsis Labs: Recent Labs  Lab 11/14/2019 1608 11/24/2019 1900  PROCALCITON 0.22  --   LATICACIDVEN 2.5* 1.7    Recent Results (from the past 240 hour(s))  Blood Culture (routine x 2)     Status: None (Preliminary result)   Collection Time: 11/12/2019  4:14 PM   Specimen: BLOOD LEFT HAND  Result Value Ref Range Status   Specimen Description BLOOD LEFT HAND  Final   Special Requests   Final    BOTTLES DRAWN AEROBIC AND ANAEROBIC Blood Culture results may not be optimal due to an inadequate volume of blood received in culture bottles   Culture   Final    NO GROWTH < 24 HOURS Performed at [facility], [address] [place], [place] [address]    Report Status PENDING  Incomplete  Blood Culture (routine x 2)     Status: None (Preliminary result)   Collection Time: 11/22/2019  4:23 PM   Specimen:  BLOOD RIGHT HAND  Result Value Ref Range Status   Specimen Description BLOOD RIGHT HAND  Final   Special Requests   Final    BOTTLES DRAWN AEROBIC AND ANAEROBIC Blood Culture results may not be optimal due to an inadequate volume of blood received in culture bottles   Culture   Final    NO GROWTH < 24 HOURS Performed at [facility], [address] [place], [place] [address]    Report Status PENDING  Incomplete  Respiratory Panel by RT PCR (Flu A&B, Covid) - Nasopharyngeal Swab     Status: Abnormal   Collection Time: 11/18/2019  5:29 PM   Specimen: Nasopharyngeal Swab  Result Value Ref Range Status   SARS Coronavirus 2  by RT PCR POSITIVE (A) NEGATIVE Final    Comment: RESULT CALLED TO, READ BACK BY AND VERIFIED WITH: C STURGHAN RN 12-Dec-2019 1826 JDW (NOTE) SARS-CoV-2 target nucleic acids are DETECTED. SARS-CoV-2 RNA is generally detectable in upper respiratory specimens  during the acute phase of infection. Positive results are indicative of the presence of the identified virus, but do not rule out bacterial infection or co-infection with other pathogens not detected by the test. Clinical correlation with patient history and other diagnostic information is necessary to determine patient infection status. The expected result is Negative. Fact Sheet for Patients:  https://www.moore.com/ Fact Sheet for Healthcare Providers: https://www.young.biz/ This test is not yet approved or cleared by the [place] FDA and  has been authorized for detection and/or diagnosis of SARS-CoV-2 by FDA under an Emergency Use Authorization (EUA).  This EUA will remain in effect (meaning this test can be used) for th e duration of  the COVID-19 declaration under Section 564(b)(1) of the Act, 21 U.S.C. section 360bbb-3(b)(1), unless the authorization is terminated or revoked sooner.    Influenza A by PCR NEGATIVE NEGATIVE Final   Influenza B by PCR NEGATIVE  NEGATIVE Final    Comment: (NOTE) The Xpert Xpress SARS-CoV-2/FLU/RSV assay is intended as an aid in  the diagnosis of influenza from Nasopharyngeal swab specimens and  should not be used as a sole basis for treatment. Nasal washings and  aspirates are unacceptable for Xpert Xpress SARS-CoV-2/FLU/RSV  testing. Fact Sheet for Patients: https://www.moore.com/ Fact Sheet for Healthcare Providers: https://www.young.biz/ This test is not yet approved or cleared by the [place] FDA and  has been authorized for detection and/or diagnosis of SARS-CoV-2 by  FDA under an Emergency Use Authorization (EUA). This EUA will remain  in effect (meaning this test can be used) for the duration of the  Covid-19 declaration under Section 564(b)(1) of the Act, 21  U.S.C. section 360bbb-3(b)(1), unless the authorization is  terminated or revoked. Performed at [facility], [address] [place], [place] [address]          Radiology Studies: CT Angio Chest PE W and/or Wo Contrast  Result Date: 12-12-19 CLINICAL DATA:  Shortness of breath, COVID-19 positive EXAM: CT ANGIOGRAPHY CHEST WITH CONTRAST TECHNIQUE: Multidetector CT imaging of the chest was performed using the standard protocol during bolus administration of intravenous contrast. Multiplanar CT image reconstructions and MIPs were obtained to evaluate the vascular anatomy. CONTRAST:  OMNIPAQUE IOHEXOL 350 MG/ML SOLN COMPARISON:  Same day chest radiograph FINDINGS: Cardiovascular: Satisfactory opacification of the pulmonary arteries to the segmental level. No evidence of pulmonary embolism. Normal heart size. No pericardial effusion. Thoracic aorta is nonaneurysmal. Atherosclerotic calcifications of the aorta and coronary arteries. Mediastinum/Nodes: Mediastinal lymphadenopathy. Reference nodes include AP window node measuring 19 mm short axis (series 4, image 39) and precarinal node measuring 17  mm (series 4, image 42). Mildly prominent bilateral hilar lymph nodes. No axillary lymphadenopathy. Unremarkable thyroid gland, trachea, and esophagus. Lungs/Pleura: Extensive ground-glass opacities throughout both lungs, largely in a peripheral distribution. No pleural effusion or pneumothorax. Upper Abdomen: No acute findings. Musculoskeletal: No chest wall abnormality. No acute or significant osseous findings. Review of the MIP images confirms the above findings. IMPRESSION: 1. No evidence of acute pulmonary embolism. 2. Extensive ground-glass opacities throughout both lungs, compatible with multifocal pneumonia in the setting of known COVID 19 infection. 3. Mediastinal lymphadenopathy, likely reactive. 4. Aortic and coronary artery atherosclerosis. Electronically Signed   By: Duanne Guess D.O.  On: 10/31/2019 20:00   DG Chest Port 1 View  Result Date: 11/17/2019 CLINICAL DATA:  Pt BIB [facility] from home, c/o shortness of breath and chest pain when coughing. Diagnosed with COVID on 1/22. On EMS arrival, pt 75% on room air, improved to 90% on simple mask @ 9L. On arrival to ED, pt 74% on room air, improved to 90% on 15L. Pt A&O x 4, denies pain at this time. EXAM: PORTABLE CHEST 1 VIEW COMPARISON:  04/22/2015 FINDINGS: There are bilateral hazy airspace opacities, most confluent in the right perihilar and medial upper lung zone, all new since the prior chest radiograph. No pleural effusion or pneumothorax. Cardiac silhouette is normal in size. No convincing mediastinal or hilar masses. Skeletal structures are grossly intact. IMPRESSION: 1. Bilateral airspace lung opacities consistent with multifocal COVID-19 pneumonia. Electronically Signed   By: Amie Portland M.D.   On: 11/22/2019 16:23        Scheduled Meds: . aspirin EC  81 mg Oral Daily  . dexamethasone (DECADRON) injection  6 mg Intravenous Q24H  . enoxaparin (LOVENOX) injection  50 mg Subcutaneous Q24H   Continuous Infusions: . remdesivir  100 mg in NS 100 mL 100 mg (11/22/19 1043)  . tocilizumab (ACTEMRA) - non-COVID treatment       LOS: 1 day     Alwyn Ren, MD [facility]  If 7PM-7AM, please contact night-coverage [email or web address] Password [email or web address] 11/22/2019, 11:13 AM

## 2019-11-22 NOTE — Consult Note (Signed)
NAME:  Matthew Mendez, MRN:  [id number], DOB:  Feb 04, 1941, LOS: 1 ADMISSION DATE:  2019-12-19, CONSULTATION DATE:  11/22/19 REFERRING MD:  Rodena Piety, CHIEF COMPLAINT:  Hypoxic respiratory failure   Brief History   79 y.o. M with PMH of osteoarthritis who was diagnosed with Covid-19 and admitted with pneumonia on 1/28.   In the evenong of 1/29 patient had an episode of confusion, hypoxia and new onset atrial fibrillation and required increasing oxygen supplementation, therefore PCCM consulted.  History of present illness   Matthew Mendez is a 79 y.o. M with PMH of arthritis who lives at home who was diagnosed with Covid-19 approximately 1/22 and presented to the ED on 1/28 for shortness of breath and found to have Covid-19 pneumonia.  He was admitted and started on Remdesevir, Dexamethasone and received Actemra.  CT scan chest showed bilateral extensive ground glass opacities without PE.    Per nursing report, he did well on 15L non-rebreather through most of the day, then fell asleep this afternoon.  When he woke up, he was confused and tried to get out of bed to use the restroom.  He then pulled off his non-rebreather and oxygen sats dropped into the 50's transiently and patient converted to atrial fibrillation with HR in the 190's briefly.   He was transitioned to HFNC and given Metoprolol 5mg  and Cardizem 10mg  x2 with HR improving to 90's.  On evaluation, pt is awake and alert and in no distress.  Speaking on the telephone and reports no dyspnea, chest pain or pressure, palpitations or other complaints.   Pt denies any history of OSA or COPD and has no smoking history.  Maintaining oxygen saturations >92% on HFNC and non-rebreather.  Past Medical History   has a past medical history of Arthritis and Urinary frequency.   Significant Hospital Events   1/28  Admit to Hospitalists 1/29 PCCM consult  Consults:  PCCM  Procedures:    Significant Diagnostic Tests:  1/28 CT chest>>Extensive  ground-glass opacities throughout both lungs,compatible with multifocal pneumonia in the setting of known COVID19 infection. 1/29 CXR>>Improving bilateral pulmonary infiltrates, most focal in the right upper lobe.  Micro Data:  1/28 Sars-CoV-2>>positive 1/28 BCx2>>  Antimicrobials:  Remdesevir 1/28-  Interim history/subjective:  Mental status improving and maintain oxygen saturations >92% on 45L HFNC  Objective   Blood pressure 110/63, pulse 95, temperature 98.6 F (37 C), resp. rate (!) 27, height 5\' 8"  (1.727 m), weight 110.7 kg, SpO2 (!) 88 %.    FiO2 (%):  [100 %] 100 %   Intake/Output Summary (Last 24 hours) at 11/22/2019 2032 Last data filed at 11/22/2019 1500 Gross per 24 hour  Intake --  Output 2475 ml  Net -2475 ml   Filed Weights   12/19/19 2040  Weight: 110.7 kg    General:  Elderly M, awake and in no distress HEENT: MM pink/moist Neuro: alert and oriented, moving all extremities and following commands without focal deficit CV: s1s2 rrr, no m/r/g PULM:  Decreased breath sounds bilateral bases, no wheezing or significant rhonchi, no accessory muscle use or tachypnea GI: soft, bsx4 active  Extremities: warm/dry, no edema  Skin: no rashes or lesions  Resolved Hospital Problem list     Assessment & Plan:    Acute Hypoxic Respiratory failure secondary to Covid-19 Pneumonia -Pt is on maximal HFNC at 45L and 100% FiO2, however is asymptomatic and without distress with improving oxygen sats P: -Repeat CXR shows slightly improving infiltrates without significant pulmonary edema -  continue current Remdesevir and Dexamethasone -Consider Lasix if continues to de-saturate -Pt unable to fully prone 2/2 knee surgery and discomfort, but attempt laying on side as able -As sats are improving will hold off ICU transfer and titrate HFNC down maintaining sats >92%, however if any worsening please notify critical care, pt affirms he would want intubated if necessary   New  onset Atrial Fibrillation -likely secondary to acute viral infection, asymptomatic -Received Metoprolol and Cardizem and HR improved, appears back in SR P: -Check TSH and repeat EKG in the AM -continue Asa  -CT chest done on admission shows coronary atherosclerosis, will obtain echocardiogram   Best practice:  Diet: regular  Pain/Anxiety/Delirium protocol (if indicated): n/a VAP protocol (if indicated): n/a DVT prophylaxis: Lovenox GI prophylaxis: n/a Glucose control: n/a,  Mobility: bed rest Code Status: full code Family Communication: per primary team Disposition: progressive care  Labs   CBC: Recent Labs  Lab 10/25/2019 1608 11/22/19 0853  WBC 8.9 7.9  NEUTROABS 7.5 6.7  HGB 14.2 14.8  HCT 41.7 43.7  MCV 91.9 93.2  PLT 184 171    Basic Metabolic Panel: Recent Labs  Lab 10/31/2019 1608 11/22/19 0853  NA 132* 136  K 3.8 4.2  CL 99 101  CO2 21* 23  GLUCOSE 154* 153*  BUN 10 10  CREATININE 0.99 0.77  CALCIUM 8.4* 8.8*   GFR: Estimated Creatinine Clearance: 93.3 mL/min (by C-G formula based on SCr of 0.77 mg/dL). Recent Labs  Lab 10/29/2019 1608 11/15/2019 1900 11/22/19 0853  PROCALCITON 0.22  --   --   WBC 8.9  --  7.9  LATICACIDVEN 2.5* 1.7  --     Liver Function Tests: Recent Labs  Lab 11/08/2019 1608 11/22/19 0853  AST 58* 42*  ALT 30 30  ALKPHOS 47 49  BILITOT 1.3* 0.5  PROT 6.5 6.7  ALBUMIN 2.8* 2.6*   No results for input(s): LIPASE, AMYLASE in the last 168 hours. No results for input(s): AMMONIA in the last 168 hours.  ABG No results found for: PHART, PCO2ART, PO2ART, HCO3, TCO2, ACIDBASEDEF, O2SAT   Coagulation Profile: No results for input(s): INR, PROTIME in the last 168 hours.  Cardiac Enzymes: No results for input(s): CKTOTAL, CKMB, CKMBINDEX, TROPONINI in the last 168 hours.  HbA1C: No results found for: HGBA1C  CBG: No results for input(s): GLUCAP in the last 168 hours.  Review of Systems:   Negative except as noted in  HPI  Past Medical History  He,  has a past medical history of Arthritis and Urinary frequency.   Surgical History    Past Surgical History:  Procedure Laterality Date  . APPENDECTOMY    . KNEE ARTHROPLASTY Right   . TONSILLECTOMY    . 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 He has never used smokeless tobacco. He reports that he does not drink alcohol or use drugs.   Family History   His family history is not on file.   Allergies No Known Allergies   Home Medications  Prior to Admission medications   Medication Sig Start Date End Date Taking? Authorizing Provider  acetaminophen (TYLENOL) 500 MG tablet Take 1,000 mg by mouth every 6 (six) hours as needed for mild pain, fever or headache.   Yes  [provider]  ascorbic acid (VITAMIN C) 500 MG tablet Take 500-1,000 mg by mouth daily.   Yes [provider]  aspirin 81 MG EC tablet Take 81 mg by mouth daily.   Yes [provider]  naproxen sodium (ALEVE) 220 MG tablet Take 220-440 mg by mouth 2 (two) times daily as needed (for arthritis pain).    Yes [provider]  Phenyleph-Doxylamine-DM-APAP (TYLENOL COLD/FLU/COUGH NIGHT) 5-6.25-10-325 MG/15ML LIQD Take 20 mLs by mouth every 4 (four) hours as needed (for flu-like symptoms).    Yes [provider]  trimethoprim-polymyxin b (POLYTRIM) ophthalmic solution Place 1 drop into the left eye every 4 (four) hours. Patient not taking: Reported on 2019/12/01 04/11/17   Deatra Canter, FNP     Critical care time: 45 minutes       CRITICAL CARE Performed by: Darcella Gasman Amir Glaus   Total critical care time: 45 minutes  Critical care time was exclusive of separately billable procedures and treating other  patients.  Critical care was necessary to treat or prevent imminent or life-threatening deterioration.  Critical care was time spent personally by me on the following activities: development of treatment plan with patient and/or surrogate as well as nursing, discussions with consultants, evaluation of patient's response to treatment, examination of patient, obtaining history from patient or surrogate, ordering and performing treatments and interventions, ordering and review of laboratory studies, ordering and review of radiographic studies, pulse oximetry and re-evaluation of patient's condition.   Darcella Gasman Alizay Bronkema, PA-C

## 2019-11-22 NOTE — Progress Notes (Signed)
Updated patient's wife Bonita Quin about the current plan for the patient.  She had no further questions at this time.

## 2019-11-22 NOTE — Progress Notes (Signed)
Received report from ED RN [facility] prior to patient coming to floor at 2140 10/27/2019. Patient arrived via stretcher and RN at the bedside. Patient alert and oriented x 4, on face mask at 15 liters 02 saturations 80's. Patient remains sob on exertion, patient was cold to touch on admission, did warm hands and body with warm blankets and changed oxygen probe patient went up from 80 percent saturations to 90- to 93% on the 15 liters. Patient states he is hungry and has not eaten since this am, reinforced with patient due to his extreme sob and level of oxygen the doctor has ordered him to be npo. Patient denies experiencing any pain at this time. Patient is very Hard of hearing. Went over the bed alarm tv and reinforced with patient to call for assistance for bathroom and any other activity patient verbalized understanding. As of now patient is 92% on the 15 liters face mask, and temp is 97.8. Patient is resting with hob greater than 45 degrees.

## 2019-11-22 NOTE — Plan of Care (Signed)
  Problem: Education: Goal: Knowledge of risk factors and measures for prevention of condition will improve 11/22/2019 0027 by Asencion Noble, RN Outcome: Progressing 11/22/2019 0026 by Asencion Noble, RN Outcome: Progressing

## 2019-11-22 NOTE — Plan of Care (Signed)
  Problem: Education: Goal: Knowledge of risk factors and measures for prevention of condition will improve Outcome: Progressing   Problem: Coping: Goal: Psychosocial and spiritual needs will be supported Outcome: Progressing   Problem: Respiratory: Goal: Will maintain a patent airway Outcome: Progressing Goal: Complications related to the disease process, condition or treatment will be avoided or minimized Outcome: Progressing   Problem: Respiratory: Goal: Complications related to the disease process, condition or treatment will be avoided or minimized Outcome: Progressing   

## 2019-11-22 NOTE — Significant Event (Signed)
Rapid Response Event Note  Overview: Tachycardia - HR 170s and Hypoxia  Initial Focused Assessment: Per staff patient was trying to ambulate or get out from the bed, he had taken his oxygen off (NRB 15L) and his HR jumped into the 170s. Oxygen saturations also dropped per nurse. Upon my arrival, RT had just placed the patient on HHFNC 100% 50 L in addition to NRB 15L. RR was 26-32, mild use of accessory muscles, not in acute distress when I arrived. He was alert and oriented mostly, had brief periods of confusion it seemed like but could be redirected easily. Saturations remained 85-90%. HR was AF 150-170s. MD ordered Diltiazem and came to the bedside. Skin warm and pink - perfusing. Normotensive.   Interventions: -- Diltiazem 10 mg IV x 2 - converted to SR - SBP remained > 90 after doses.  -- Morphine 0.5 mg IV given by bedside nurse  -- 2nd PIV placed 22G LW  Plan of Care: -- Wean oxygen as tolerated -- Monitor VS ordered  -- PCCM consulted for possible transfer to the ICU- currently patient is NOT in acute distress however my overall my clinical recommendation is that the patient be transferred to the ICU given his current oxygen requirements.   Event Summary:  Call Time 1822  End Time [address], [place] Manago R

## 2019-11-23 ENCOUNTER — Other Ambulatory Visit (HOSPITAL_COMMUNITY): Payer: Medicare HMO

## 2019-11-23 ENCOUNTER — Inpatient Hospital Stay (HOSPITAL_COMMUNITY): Payer: Medicare HMO

## 2019-11-23 DIAGNOSIS — U071 COVID-19: Principal | ICD-10-CM

## 2019-11-23 DIAGNOSIS — J1282 Pneumonia due to coronavirus disease 2019: Secondary | ICD-10-CM

## 2019-11-23 LAB — POCT I-STAT 7, (LYTES, BLD GAS, ICA,H+H)
Bicarbonate: 31.8 mmol/L — ABNORMAL HIGH (ref 20.0–28.0)
Calcium, Ion: 1.24 mmol/L (ref 1.15–1.40)
HCT: 50 % (ref 39.0–52.0)
Hemoglobin: 17 g/dL (ref 13.0–17.0)
O2 Saturation: 97 %
Patient temperature: 97.8
Potassium: 4 mmol/L (ref 3.5–5.1)
Sodium: 141 mmol/L (ref 135–145)
TCO2: 34 mmol/L — ABNORMAL HIGH (ref 22–32)
pCO2 arterial: 85.6 mmHg (ref 32.0–48.0)
pH, Arterial: 7.176 — CL (ref 7.350–7.450)
pO2, Arterial: 114 mmHg — ABNORMAL HIGH (ref 83.0–108.0)

## 2019-11-23 LAB — COMPREHENSIVE METABOLIC PANEL
ALT: 29 U/L (ref 0–44)
AST: 40 U/L (ref 15–41)
Albumin: 2.5 g/dL — ABNORMAL LOW (ref 3.5–5.0)
Alkaline Phosphatase: 55 U/L (ref 38–126)
Anion gap: 13 (ref 5–15)
BUN: 19 mg/dL (ref 8–23)
CO2: 25 mmol/L (ref 22–32)
Calcium: 8.8 mg/dL — ABNORMAL LOW (ref 8.9–10.3)
Chloride: 99 mmol/L (ref 98–111)
Creatinine, Ser: 0.78 mg/dL (ref 0.61–1.24)
GFR calc Af Amer: >60 mL/min (ref >60)
GFR calc non Af Amer: >60 mL/min (ref >60)
Glucose, Bld: 170 mg/dL — ABNORMAL HIGH (ref 70–99)
Potassium: 3.9 mmol/L (ref 3.5–5.1)
Sodium: 137 mmol/L (ref 135–145)
Total Bilirubin: 0.6 mg/dL (ref 0.3–1.2)
Total Protein: 6.7 g/dL (ref 6.5–8.1)

## 2019-11-23 LAB — BLOOD GAS, ARTERIAL
Acid-Base Excess: 3.4 mmol/L — ABNORMAL HIGH (ref 0.0–2.0)
Bicarbonate: 27.4 mmol/L (ref 20.0–28.0)
FIO2: 100
O2 Saturation: 81.1 %
Patient temperature: 37
pCO2 arterial: 41.6 mmHg (ref 32.0–48.0)
pH, Arterial: 7.434 (ref 7.350–7.450)
pO2, Arterial: 47 mmHg — ABNORMAL LOW (ref 83.0–108.0)

## 2019-11-23 LAB — CBC WITH DIFFERENTIAL/PLATELET
Abs Immature Granulocytes: 0.09 10*3/uL — ABNORMAL HIGH (ref 0.00–0.07)
Basophils Absolute: 0 10*3/uL (ref 0.0–0.1)
Basophils Relative: 0 %
Eosinophils Absolute: 0 10*3/uL (ref 0.0–0.5)
Eosinophils Relative: 0 %
HCT: 44.8 % (ref 39.0–52.0)
Hemoglobin: 15.1 g/dL (ref 13.0–17.0)
Immature Granulocytes: 1 %
Lymphocytes Relative: 8 %
Lymphs Abs: 1.1 10*3/uL (ref 0.7–4.0)
MCH: 31.1 pg (ref 26.0–34.0)
MCHC: 33.7 g/dL (ref 30.0–36.0)
MCV: 92.4 fL (ref 80.0–100.0)
Monocytes Absolute: 0.6 10*3/uL (ref 0.1–1.0)
Monocytes Relative: 4 %
Neutro Abs: 12.1 10*3/uL — ABNORMAL HIGH (ref 1.7–7.7)
Neutrophils Relative %: 87 %
Platelets: 258 10*3/uL (ref 150–400)
RBC: 4.85 MIL/uL (ref 4.22–5.81)
RDW: 13 % (ref 11.5–15.5)
WBC: 14 10*3/uL — ABNORMAL HIGH (ref 4.0–10.5)
nRBC: 0 % (ref 0.0–0.2)

## 2019-11-23 LAB — C-REACTIVE PROTEIN: CRP: 19.1 mg/dL — ABNORMAL HIGH (ref <1.0)

## 2019-11-23 LAB — ECHOCARDIOGRAM LIMITED
Height: 68 in
Weight: 3904.79 oz

## 2019-11-23 LAB — GLUCOSE, CAPILLARY: Glucose-Capillary: 210 mg/dL — ABNORMAL HIGH (ref 70–99)

## 2019-11-23 LAB — FERRITIN: Ferritin: 1559 ng/mL — ABNORMAL HIGH (ref 24–336)

## 2019-11-23 LAB — TSH: TSH: 2.449 u[IU]/mL (ref 0.350–4.500)

## 2019-11-23 MED ORDER — MIDAZOLAM HCL 2 MG/2ML IJ SOLN: 2.0000 mg | Freq: Once | INTRAMUSCULAR | Status: AC

## 2019-11-23 MED ORDER — CHLORHEXIDINE GLUCONATE CLOTH 2 % EX PADS
6.0000 | MEDICATED_PAD | Freq: Every day | CUTANEOUS | Status: DC
  Administered 2019-11-23 – 2019-11-24 (×2): 6 via TOPICAL

## 2019-11-23 MED ORDER — FUROSEMIDE 10 MG/ML IJ SOLN
40.0000 mg | Freq: Once | INTRAMUSCULAR | Status: AC
  Administered 2019-11-23: 40 mg via INTRAVENOUS
  Filled 2019-11-23: qty 4

## 2019-11-23 MED ORDER — ALBUTEROL SULFATE HFA 108 (90 BASE) MCG/ACT IN AERS
1.0000 | INHALATION_SPRAY | RESPIRATORY_TRACT | Status: DC | PRN
  Filled 2019-11-23: qty 6.7

## 2019-11-23 MED ORDER — PROPOFOL 500 MG/50ML IV EMUL
INTRAVENOUS | Status: AC
  Administered 2019-11-23: 5 ug/kg/min via INTRAVENOUS
  Filled 2019-11-23: qty 50

## 2019-11-23 MED ORDER — FENTANYL 2500MCG IN NS 250ML (10MCG/ML) PREMIX INFUSION
25.0000 ug/h | INTRAVENOUS | Status: DC
  Administered 2019-11-23: 25 ug/h via INTRAVENOUS
  Filled 2019-11-23 (×2): qty 250

## 2019-11-23 MED ORDER — ROCURONIUM BROMIDE 50 MG/5ML IV SOLN
100.0000 mg | Freq: Once | INTRAVENOUS | Status: AC
  Administered 2019-11-23: 100 mg via INTRAVENOUS
  Filled 2019-11-23: qty 10

## 2019-11-23 MED ORDER — LORAZEPAM 2 MG/ML IJ SOLN
1.0000 mg | Freq: Once | INTRAMUSCULAR | Status: AC
  Administered 2019-11-23: 1 mg via INTRAVENOUS
  Filled 2019-11-23: qty 1

## 2019-11-23 MED ORDER — PHENYLEPHRINE HCL-NACL 10-0.9 MG/250ML-% IV SOLN
INTRAVENOUS | Status: AC
  Filled 2019-11-23: qty 250

## 2019-11-23 MED ORDER — PROPOFOL 1000 MG/100ML IV EMUL: 0.0000 ug/kg/min | INTRAVENOUS | Status: DC

## 2019-11-23 MED ORDER — SODIUM CHLORIDE 0.9 % IV SOLN
2.0000 g | Freq: Three times a day (TID) | INTRAVENOUS | Status: DC
  Administered 2019-11-23 – 2019-11-24 (×2): 2 g via INTRAVENOUS
  Filled 2019-11-23 (×2): qty 2

## 2019-11-23 MED ORDER — LABETALOL HCL 5 MG/ML IV SOLN: 10.0000 mg | INTRAVENOUS | Status: DC | PRN

## 2019-11-23 MED ORDER — ETOMIDATE 2 MG/ML IV SOLN
20.0000 mg | Freq: Once | INTRAVENOUS | Status: AC
  Administered 2019-11-23: 20 mg via INTRAVENOUS

## 2019-11-23 MED ORDER — FENTANYL CITRATE (PF) 100 MCG/2ML IJ SOLN: 25.0000 ug | Freq: Once | INTRAMUSCULAR | Status: DC

## 2019-11-23 MED ORDER — FENTANYL BOLUS VIA INFUSION
25.0000 ug | INTRAVENOUS | Status: DC | PRN
  Administered 2019-11-23 – 2019-11-24 (×2): 25 ug via INTRAVENOUS
  Filled 2019-11-23: qty 25

## 2019-11-23 MED ORDER — SODIUM CHLORIDE 0.9% IV SOLUTION: Freq: Once | INTRAVENOUS | Status: AC

## 2019-11-23 MED ORDER — MIDAZOLAM HCL 2 MG/2ML IJ SOLN
INTRAMUSCULAR | Status: AC
  Administered 2019-11-23: 2 mg via INTRAVENOUS
  Filled 2019-11-23: qty 2

## 2019-11-23 NOTE — Progress Notes (Signed)
  Echocardiogram 2D Echocardiogram has been performed.  Leta Jungling M 11/23/2019, 12:09 PM

## 2019-11-23 NOTE — Significant Event (Signed)
Rapid Response Event Note  Overview: Hypoxic respiratory failure with AMS  Initial Focused Assessment: Notified by nursing staff of pt with acute desaturation to 70s. Upon arrival, Tim RRT at bedside and pt on HHFNC at 60L and 100% Fio2 and NRB at 15L. RR 30-35 with accessory muscle use and cyanotic extremities. Sats 81%. Mr. Matthew Mendez is alert, oriented to self and disoriented to place, time and situation. He is disrupting medical devices and trying to get OOB. He also is swatting at staff when reorientation is attempted. BBS Coarse Rhonchi. Stevie Kern NP notified and ABG obtained. PCXR done. Lasix 40 mg IV ordered and given as well as Ativan 1mg  IV.  Bodenheimer to reconsulting PCCM after signing off on Mr. Matthew Mendez earlier today. Anticipate tx to ICU for possible intubation. While waiting for ICU transfer, pt escalated to 70L flow on HHFNC due to persistent hypoxia without rebound and confusion.   ABG 7.43/41.6/47/27  Interventions: -HHFNC 70L &100% FIO2 and NRB 15L -tx to ICU    Event Summary: Call received 1937 Arrived at call 1948 Call ended 2220  2221

## 2019-11-23 NOTE — Progress Notes (Signed)
PHARMACY - PHYSICIAN COMMUNICATION CRITICAL VALUE ALERT - BLOOD CULTURE IDENTIFICATION (BCID)  ADISA LITT is an 79 y.o. male who presented to [facility] on 12/17/19 with a chief complaint of SOB.  Assessment:  1 of 4 bottles growing GPR - possibly a contaminant.  Afebrile, WBC up to 14 likely due to steroid, PCT 0.22 on admit.  Name of physician (or Provider) Contacted:  Dr. [place] Royalty  Current antibiotics: none  Changes to prescribed antibiotics recommended:  Recommendations declined by provider due to clinical deterioration.  MD will place order to start antibiotic.  No results found for this or any previous visit.  Rebekha Diveley D. Laney Potash, PharmD, BCPS, BCCCP 11/23/2019, 2:29 PM

## 2019-11-23 NOTE — Progress Notes (Signed)
PROGRESS NOTE    Matthew Mendez  ZRA:[id number] DOB: Mar 14, 1941 DOA: Dec 09, 2019 PCP: Matthew Ping, MD    Brief Narrative: 79 y.o.malewith medical history significant ofwith HLD, BPH who presents with concerns of hypoxia. Pt was diagnosed with COVID about a week agoon 1/22. Wife also positive.He had several days of headache and fever up to 102. Notes decrease appetite. Had mild cough but no shortness of breath. No chest pain. No nausea, vomiting or diarrhea.  On EMS arrival he was satting at 70% on room airand placed on NRB. In the ER, he had a temperature of 99.38F, borderline blood pressure and was tachypneic requiring NRB 15L.  CBC was unremarkable. Na of 132, glucose of 154, AST of 58, ALT of 30, Total bilirubin of 1.3 CRP of 22.5, lactate of 2.5, procalcitonin of 0.22 D-dimerof3-CTA chestshowed nopulmonary embolism and extensive ground-glass opacities throughout.  CXR consistent with multifocal viral pneumonia.   He was started on decadron, remdesivir and given 500cc LR bolus  11/23/2019 patient became hypoxic yesterday evening after taking of the mass to go to the restroom.  He was stable all day yesterday on 15 L of oxygen.  Currently he is on 50 L of oxygen satting above 88%.  He does not appear to be in any distress though. He is constantly asking for food and drink as he is hungry. He received metoprolol 5 mg, Cardizem 20 mg x 1 for A. fib RVR last evening and he converted to sinus rhythm. Appreciate PCCM input.  Assessment & Plan:   Principal Problem:   Pneumonia due to COVID-19 virus Active Problems:   Transaminitis   Total bilirubin, elevated   Respiratory failure, acute (HCC)  #1 acute hypoxic respiratory failure secondary to Covid pneumonia-chest x-ray consistent with bilateral groundglass opacities. Repeat chest x-ray 11/13/2019-improving infiltrates CRP 19.1 down from 25.4 Ferritin 1559 White count 14 likely from steroids Normal  procalcitonin Currently on 50  L of oxygen. Patient reports dyspnea on exertion Continue Decadron and remdesivir Received Actemra 11/22/2019 Will give convalescent plasma today 11/23/2019 discussed with family Vitamin C vitamin D3  #2 history of BPH no complaints stable  #3 history of hyperlipidemia not on any medications at home.  Estimated body mass index is 37.11 kg/m as calculated from the following:   Height as of this encounter: 5\' 8"  (1.727 m).   Weight as of this encounter: 110.7 kg. DVT prophylaxis: Lovenox Code Status full code  family Communication: Discussed with patient's wife on the phone  disposition Plan: Pending clinical improvement.  Patient came from home hopefully he can be discharged back home with home health once he is stable.  He is on 50 L of oxygen Consultants:   None  Procedures: None Antimicrobials: None   Subjective: Reports he feels better asking for something to eat or drink he feels his breathing is better Chest x-ray showed improving bilateral pulmonary infiltrates  Objective: Vitals:   11/23/19 0600 11/23/19 0632 11/23/19 0652 11/23/19 0928  BP:      Pulse: 63  (!) 53   Resp: (!) 27  (!) 28   Temp:      TempSrc:      SpO2: (!) 89% 93% 90% 90%  Weight:      Height:        Intake/Output Summary (Last 24 hours) at 11/23/2019 1059 Last data filed at 11/23/2019 0500 Gross per 24 hour  Intake 0 ml  Output 750 ml  Net -750 ml   12/06/2019  11/12/2019 2040  Weight: 110.7 kg    Examination:  General exam: Appears calm and comfortable  Respiratory system: Diminished breath sounds at the bases to auscultation. Respiratory effort normal. Cardiovascular system: S1 & S2 heard, RRR. No JVD, murmurs, rubs, gallops or clicks. No pedal edema. Gastrointestinal system: Abdomen is nondistended, soft and nontender. No organomegaly or masses felt. Normal bowel sounds heard. Central nervous system: Alert and oriented. No focal neurological  deficits. Extremities: Symmetric 5 x 5 power. Skin: No rashes, lesions or ulcers Psychiatry: Judgement and insight appear normal. Mood & affect appropriate.     Data Reviewed: I have personally reviewed following labs and imaging studies  CBC: Recent Labs  Lab 11/20/2019 1608 11/22/19 0853 11/23/19 0500  WBC 8.9 7.9 14.0*  NEUTROABS 7.5 6.7 12.1*  HGB 14.2 14.8 15.1  HCT 41.7 43.7 44.8  MCV 91.9 93.2 92.4  PLT 184 171 258   Basic Metabolic Panel: Recent Labs  Lab 11/20/2019 1608 11/22/19 0853 11/22/19 2212 11/23/19 0500  NA 132* 136  --  137  K 3.8 4.2  --  3.9  CL 99 101  --  99  CO2 21* 23  --  25  GLUCOSE 154* 153*  --  170*  BUN 10 10  --  19  CREATININE 0.99 0.77  --  0.78  CALCIUM 8.4* 8.8*  --  8.8*  MG  --   --  2.4  --    GFR: Estimated Creatinine Clearance: 93.3 mL/min (by C-G formula based on SCr of 0.78 mg/dL). Liver Function Tests: Recent Labs  Lab 11/11/2019 1608 11/22/19 0853 11/23/19 0500  AST 58* 42* 40  ALT 30 30 29   ALKPHOS 47 49 55  BILITOT 1.3* 0.5 0.6  PROT 6.5 6.7 6.7  ALBUMIN 2.8* 2.6* 2.5*   No results for input(s): LIPASE, AMYLASE in the last 168 hours. No results for input(s): AMMONIA in the last 168 hours. Coagulation Profile: No results for input(s): INR, PROTIME in the last 168 hours. Cardiac Enzymes: No results for input(s): CKTOTAL, CKMB, CKMBINDEX, TROPONINI in the last 168 hours. BNP (last 3 results) No results for input(s): PROBNP in the last 8760 hours. HbA1C: No results for input(s): HGBA1C in the last 72 hours. CBG: No results for input(s): GLUCAP in the last 168 hours. Lipid Profile: Recent Labs    11/24/2019 1608  TRIG 96   Thyroid Function Tests: Recent Labs    11/23/19 0500  TSH 2.449   Anemia Panel: Recent Labs    11/22/19 0853 11/23/19 0500  FERRITIN 1,324* 1,559*   Sepsis Labs: Recent Labs  Lab 11/20/2019 1608 11/09/2019 1900  PROCALCITON 0.22  --   LATICACIDVEN 2.5* 1.7    Recent Results  (from the past 240 hour(s))  Blood Culture (routine x 2)     Status: None (Preliminary result)   Collection Time: 11/04/2019  4:14 PM   Specimen: BLOOD LEFT HAND  Result Value Ref Range Status   Specimen Description BLOOD LEFT HAND  Final   Special Requests   Final    BOTTLES DRAWN AEROBIC AND ANAEROBIC Blood Culture results may not be optimal due to an inadequate volume of blood received in culture bottles   Culture   Final    NO GROWTH < 24 HOURS Performed at [facility], [address], [place], [place]    Report Status PENDING  Incomplete  Blood Culture (routine x 2)     Status: None (Preliminary result)   Collection  Time: 23-Nov-2019  4:23 PM   Specimen: BLOOD RIGHT HAND  Result Value Ref Range Status   Specimen Description BLOOD RIGHT HAND  Final   Special Requests   Final    BOTTLES DRAWN AEROBIC AND ANAEROBIC Blood Culture results may not be optimal due to an inadequate volume of blood received in culture bottles   Culture   Final    NO GROWTH < 24 HOURS Performed at [facility], [address] [place], [place] [address]    Report Status PENDING  Incomplete  Respiratory Panel by RT PCR (Flu A&B, Covid) - Nasopharyngeal Swab     Status: Abnormal   Collection Time: 2019/11/23  5:29 PM   Specimen: Nasopharyngeal Swab  Result Value Ref Range Status   SARS Coronavirus 2 by RT PCR POSITIVE (A) NEGATIVE Final    Comment: RESULT CALLED TO, READ BACK BY AND VERIFIED WITH: C STURGHAN RN 2019-11-23 1826 JDW (NOTE) SARS-CoV-2 target nucleic acids are DETECTED. SARS-CoV-2 RNA is generally detectable in upper respiratory specimens  during the acute phase of infection. Positive results are indicative of the presence of the identified virus, but do not rule out bacterial infection or co-infection with other pathogens not detected by the test. Clinical correlation with patient history and other diagnostic information is necessary to determine patient infection status.  The expected result is Negative. Fact Sheet for Patients:  https://www.moore.com/ Fact Sheet for Healthcare Providers: https://www.young.biz/ This test is not yet approved or cleared by the [place] FDA and  has been authorized for detection and/or diagnosis of SARS-CoV-2 by FDA under an Emergency Use Authorization (EUA).  This EUA will remain in effect (meaning this test can be used) for th e duration of  the COVID-19 declaration under Section 564(b)(1) of the Act, 21 U.S.C. section 360bbb-3(b)(1), unless the authorization is terminated or revoked sooner.    Influenza A by PCR NEGATIVE NEGATIVE Final   Influenza B by PCR NEGATIVE NEGATIVE Final    Comment: (NOTE) The Xpert Xpress SARS-CoV-2/FLU/RSV assay is intended as an aid in  the diagnosis of influenza from Nasopharyngeal swab specimens and  should not be used as a sole basis for treatment. Nasal washings and  aspirates are unacceptable for Xpert Xpress SARS-CoV-2/FLU/RSV  testing. Fact Sheet for Patients: https://www.moore.com/ Fact Sheet for Healthcare Providers: https://www.young.biz/ This test is not yet approved or cleared by the [place] FDA and  has been authorized for detection and/or diagnosis of SARS-CoV-2 by  FDA under an Emergency Use Authorization (EUA). This EUA will remain  in effect (meaning this test can be used) for the duration of the  Covid-19 declaration under Section 564(b)(1) of the Act, 21  U.S.C. section 360bbb-3(b)(1), unless the authorization is  terminated or revoked. Performed at [facility], [address] [place], [place] [address]          Radiology Studies: CT Angio Chest PE W and/or Wo Contrast  Result Date: 2019-11-23 CLINICAL DATA:  Shortness of breath, COVID-19 positive EXAM: CT ANGIOGRAPHY CHEST WITH CONTRAST TECHNIQUE: Multidetector CT imaging of the chest was performed using the  standard protocol during bolus administration of intravenous contrast. Multiplanar CT image reconstructions and MIPs were obtained to evaluate the vascular anatomy. CONTRAST:  OMNIPAQUE IOHEXOL 350 MG/ML SOLN COMPARISON:  Same day chest radiograph FINDINGS: Cardiovascular: Satisfactory opacification of the pulmonary arteries to the segmental level. No evidence of pulmonary embolism. Normal heart size. No pericardial effusion. Thoracic aorta is nonaneurysmal. Atherosclerotic calcifications of the  aorta and coronary arteries. Mediastinum/Nodes: Mediastinal lymphadenopathy. Reference nodes include AP window node measuring 19 mm short axis (series 4, image 39) and precarinal node measuring 17 mm (series 4, image 42). Mildly prominent bilateral hilar lymph nodes. No axillary lymphadenopathy. Unremarkable thyroid gland, trachea, and esophagus. Lungs/Pleura: Extensive ground-glass opacities throughout both lungs, largely in a peripheral distribution. No pleural effusion or pneumothorax. Upper Abdomen: No acute findings. Musculoskeletal: No chest wall abnormality. No acute or significant osseous findings. Review of the MIP images confirms the above findings. IMPRESSION: 1. No evidence of acute pulmonary embolism. 2. Extensive ground-glass opacities throughout both lungs, compatible with multifocal pneumonia in the setting of known COVID 19 infection. 3. Mediastinal lymphadenopathy, likely reactive. 4. Aortic and coronary artery atherosclerosis. Electronically Signed   By: Davina Poke D.O.   On: 11/20/2019 20:00   DG CHEST PORT 1 VIEW  Result Date: 11/22/2019 CLINICAL DATA:  Hypoxia. EXAM: PORTABLE CHEST 1 VIEW COMPARISON:  November 21, 2019 FINDINGS: Pulmonary infiltrates remain, particular in the right upper lobe. However, the infiltrates have significantly improved in the interval. No other interval changes. IMPRESSION: Improving bilateral pulmonary infiltrates, most focal in the right upper lobe.  Electronically Signed   By: Dorise Bullion III M.D   On: 11/22/2019 20:22   DG Chest Port 1 View  Result Date: 11/14/2019 CLINICAL DATA:  Pt BIB [facility] from home, c/o shortness of breath and chest pain when coughing. Diagnosed with COVID on 1/22. On EMS arrival, pt 75% on room air, improved to 90% on simple mask @ 9L. On arrival to ED, pt 74% on room air, improved to 90% on 15L. Pt A&O x 4, denies pain at this time. EXAM: PORTABLE CHEST 1 VIEW COMPARISON:  04/22/2015 FINDINGS: There are bilateral hazy airspace opacities, most confluent in the right perihilar and medial upper lung zone, all new since the prior chest radiograph. No pleural effusion or pneumothorax. Cardiac silhouette is normal in size. No convincing mediastinal or hilar masses. Skeletal structures are grossly intact. IMPRESSION: 1. Bilateral airspace lung opacities consistent with multifocal COVID-19 pneumonia. Electronically Signed   By: Lajean Manes M.D.   On: 11/16/2019 16:23        Scheduled Meds: . sodium chloride   Intravenous Once  . ascorbic acid  500 mg Oral Daily  . aspirin EC  81 mg Oral Daily  . dexamethasone (DECADRON) injection  6 mg Intravenous Q24H  . enoxaparin (LOVENOX) injection  50 mg Subcutaneous Q24H   Continuous Infusions: . amiodarone    . remdesivir 100 mg in NS 100 mL 100 mg (11/23/19 1035)     LOS: 2 days     Georgette Shell, MD [facility]  If 7PM-7AM, please contact night-coverage [email or web address] Password [email or web address] 11/23/2019, 10:59 AM

## 2019-11-23 NOTE — Progress Notes (Addendum)
eLink Physician-Brief Progress Note Patient Name: Matthew Mendez DOB: 1941-05-24 MRN: [id number]   Date of Service  11/23/2019  HPI/Events of Note  Notified of NSVT, patient also hypertensive with SBP in the 170s-180s.  PT was just intubated for hypoxemic respiratory failure.  Electrolytes within normal limits.  eICU Interventions  Start on fentanyl. If persistently hypertensive, can give labetalol prn.      Intervention Category Intermediate Interventions: Arrhythmia - evaluation and management  Larinda Buttery 11/23/2019, 11:35 PM   12:09 AM Post-intubation ABG: 7.176/85.6/114.    Plan> Increase RR and repeat ABG in 1 hour.  4:13 AM Pt is not ventilating well on repeat ABG.   Plan> Increase tidal volume to 450, rate at 35. Repeat ABG in 1 hour.

## 2019-11-23 NOTE — Progress Notes (Signed)
Patient's daughter updated on patient condition and transfer to 82M. Questions answered. Daughter verbalizes understanding.

## 2019-11-23 NOTE — Progress Notes (Signed)
Called to patients room by the nurse because of decreased Sp02 levels. Patients Sp02 80-83% on heated high flow cannula set at 60LPM and100% along with a 100% NRB. Patients respiratory rate 34-38. Arterial blood gas obtained and sent to lab, rapid response notified and in route.

## 2019-11-23 NOTE — Progress Notes (Signed)
PCCM progress note   Patient seen by night team overnight for acute decompensation with increased oxygen demand and new onset A. fib RVR. It appears patient was managed on the floor with increased supplemental oxygen and arrhythmics. Overnight into this morning patient remained stable.  Communicated with [facility] primary team no acute needs from critical care today.  PCCM will sign off. Thank you for the opportunity to participate in this patient's care. Please contact if we can be of further assistance.  Delfin Gant, NP-C [facility] Contact / Pager information can be found on Amion  11/23/2019, 12:20 PM

## 2019-11-23 NOTE — Progress Notes (Signed)
   11/23/19 1931  MEWS Score  BP (!) 168/89  Pulse Rate (!) 105  ECG Heart Rate (!) 105  Resp (!) 36  SpO2 (!) 82 %  O2 Device HFNC;Non-rebreather Mask (Heated.)  Patient Activity (if Appropriate) In bed  O2 Flow Rate (L/min) 60 L/min  FiO2 (%) 100 %  MEWS Score  MEWS Temp 0  MEWS Systolic 0  MEWS Pulse 1  MEWS RR 3  MEWS LOC 0  MEWS Score 4  MEWS Score Color Red  MEWS Assessment  Is this an acute change? No  Rapid Response Notification  Name of Rapid Response RN Notified Onalee Hua, RN  Date Rapid Response Notified 11/23/19  Time Rapid Response Notified 1937  Provider Notification  Provider Name/Title Bodenheimer, NP  Date Provider Notified 11/23/19  Time Provider Notified 1945  Notification Type Page  Notification Reason Change in status  Response See new orders  Date of Provider Response 11/23/19  Time of Provider Response 1947   RRT and Rapid Response RN in to see patient.

## 2019-11-23 NOTE — Procedures (Signed)
Intubation Procedure Note Matthew Mendez [id number] 05-Feb-1941   When Patient arrived to the MICU, sats were 69-80% on high flow nasal cannula and nonrebreather.  Patient had increased RR and was confused.  Decision was made to intubate and consent was obtained by his daughter as well.  Intubated with RSI with etomidate and rocuronium, pretreated with versed.   Patient tolerated well.     Procedure: Intubation Indications: Respiratory insufficiency  Procedure Details Consent: Risks of procedure as well as the alternatives and risks of each were explained to the (patient/caregiver).  Consent for procedure obtained. Time Out: Verified patient identification, verified procedure, site/side was marked, verified correct patient position, special equipment/implants available, medications/allergies/relevent history reviewed, required imaging and test results available.  Performed  Maximum sterile technique was used including gloves.  3    Evaluation Hemodynamic Status: BP stable throughout; O2 sats: stable throughout Patient's Current Condition: stable Complications: No apparent complications Patient did tolerate procedure well. Chest X-ray ordered to verify placement.  CXR: pending.   Ples Specter 11/23/2019

## 2019-11-23 NOTE — Plan of Care (Signed)
  Problem: Education: Goal: Knowledge of risk factors and measures for prevention of condition will improve Outcome: Progressing   Problem: Coping: Goal: Psychosocial and spiritual needs will be supported Outcome: Progressing   Problem: Respiratory: Goal: Will maintain a patent airway Outcome: Progressing Goal: Complications related to the disease process, condition or treatment will be avoided or minimized Outcome: Progressing   Problem: Education: Goal: Knowledge of General Education information will improve Description: Including pain rating scale, medication(s)/side effects and non-pharmacologic comfort measures Outcome: Progressing   Problem: Health Behavior/Discharge Planning: Goal: Ability to manage health-related needs will improve Outcome: Progressing   Problem: Clinical Measurements: Goal: Ability to maintain clinical measurements within normal limits will improve Outcome: Progressing Goal: Will remain free from infection Outcome: Progressing Goal: Diagnostic test results will improve Outcome: Progressing Goal: Cardiovascular complication will be avoided Outcome: Progressing   Problem: Nutrition: Goal: Adequate nutrition will be maintained Outcome: Progressing   Problem: Coping: Goal: Level of anxiety will decrease Outcome: Progressing   Problem: Elimination: Goal: Will not experience complications related to bowel motility Outcome: Progressing Goal: Will not experience complications related to urinary retention Outcome: Progressing   Problem: Pain Managment: Goal: General experience of comfort will improve Outcome: Progressing   Problem: Safety: Goal: Ability to remain free from injury will improve Outcome: Progressing   Problem: Skin Integrity: Goal: Risk for impaired skin integrity will decrease Outcome: Progressing   

## 2019-11-23 NOTE — Consult Note (Addendum)
NAME:  VERDIE BARROWS, MRN:  [id number], DOB:  07-26-1941, LOS: 2 ADMISSION DATE:  11/16/2019, CONSULTATION DATE:  11/23/19 REFERRING MD:  Jerolyn Center, CHIEF COMPLAINT:  Hypoxic respiratory failure   Brief History   79 y.o. M with PMH of osteoarthritis who was diagnosed with Covid-19 and admitted with pneumonia on 1/28.   In the evenong of 1/29 patient had an episode of confusion, hypoxia and new onset atrial fibrillation and required increasing oxygen supplementation.  Converted to NSR. Then, on the evening of 1/30, patient became acute hypoxic despite HFNC and NRB support, was transferred to the ICU and intubated.    History of present illness   Mr. Winterton is a 79 y.o. M with PMH of arthritis who lives at home who was diagnosed with Covid-19 approximately 1/22 and presented to the ED on 1/28 for shortness of breath and found to have Covid-19 pneumonia.  He was admitted and started on Remdesevir, Dexamethasone and received Actemra.  CT scan chest on admission showed bilateral extensive ground glass opacities without PE.  Had increased WOB and hypoxia today, unable to get sats >81%. Increased opacities on chest x-ray today, particularly in RUL fields.  Intubated after transfer to the MICU.  Of note patient did receive convalescent plasma this afternoon around 1500.     Past Medical History   has a past medical history of Arthritis and Urinary frequency.   Significant Hospital Events   1/28  Admit to Hospitalists 1/29 PCCM consult, Afib event, converted to NSR 1/30, transferred to MICU for significant hypoxia, intubated.     Consults:  PCCM  Procedures:  Intubated 1/30  Significant Diagnostic Tests:  1/28 CT chest>>Extensive ground-glass opacities throughout both lungs,compatible with multifocal pneumonia in the setting of known COVID19 infection. 1/29 CXR>>Improving bilateral pulmonary infiltrates, most focal in the right upper lobe. 1/30 CXR>> worsening infiltrates  Micro Data:    1/28 Sars-CoV-2>>positive 1/28 BCx2>>  Antimicrobials:  Remdesevir 1/28- Cefepime 1/30  Interim history/subjective:  Sats improved after intubation. HDS  Objective   Blood pressure 122/70, pulse (!) 108, temperature (!) 96.3 F (35.7 C), temperature source Axillary, resp. rate (!) 37, height 5\' 8"  (1.727 m), weight 110.7 kg, SpO2 98 %.    Vent Mode: PRVC FiO2 (%):  [100 %] 100 % Set Rate:  [20 bmp] 20 bmp Vt Set:  [410 mL] 410 mL PEEP:  [15 cmH20] 15 cmH20 Plateau Pressure:  [28 cmH20] 28 cmH20   Intake/Output Summary (Last 24 hours) at 11/23/2019 2334 Last data filed at 11/23/2019 2300 Gross per 24 hour  Intake 261 ml  Output 550 ml  Net -289 ml   Filed Weights   11/01/2019 2040  Weight: 110.7 kg    General:  Elderly M, prior to intubation, was confused, mildly agitated.  HEENT: MM pink/moist Neuro: alert, not oriented, moving all extremities and following commands without focal deficit CV: s1s2 rrr, no m/r/g PULM:  Decreased breath sounds bilateral bases, no wheezing or significant rhonchi, accessory muscle use, satting 77% GI: soft, bsx4 active   Extremities: warm/dry, no edema  Skin: no rashes or lesions  Resolved Hospital Problem list     Assessment & Plan:    Acute Hypoxic Respiratory failure secondary to Covid-19 Pneumonia ARDS Intubated 1/30, with improvement in sats.  Increasing infiltrates particularly on R side.  S/p tocilizumab 1/28, conv plasma 1/30 (this afternoon).  Currently receiving remdesivir and dexamethasone.  P: -continue current Remdesevir and Dexamethasone -continue diuresis, net neg 1 L -ABG, consider proning  if P:F <150 -ARDS protocol with goal Plat <30, TV at 6cc/kg.  -empiric cefepime for possible bacterial pneumonia.   -sedation: RASS -4 with fentanyl and propofol  New onset Atrial Fibrillation: now resolved.   Occurred 1/29, likely secondary to acute viral infection, asymptomatic.  Received Metoprolol and Cardizem and HR  improved, appears back in SR P: - echo pending - hold on AC for now    Best practice:  Diet: regular  Pain/Anxiety/Delirium protocol (if indicated): n/a VAP protocol (if indicated): n/a DVT prophylaxis: Lovenox GI prophylaxis: n/a Glucose control: n/a,  Mobility: bed rest Code Status: full code Family Communication: Daughter Jennette Kettle was updated about intubation.  She is primary point person.  Is an ICU nurse Disposition: 42M ICU  Labs   CBC: Recent Labs  Lab Dec 19, 2019 1608 11/22/19 0853 11/23/19 0500  WBC 8.9 7.9 14.0*  NEUTROABS 7.5 6.7 12.1*  HGB 14.2 14.8 15.1  HCT 41.7 43.7 44.8  MCV 91.9 93.2 92.4  PLT 184 171 258    Basic Metabolic Panel: Recent Labs  Lab 2019/12/19 1608 11/22/19 0853 11/22/19 2212 11/23/19 0500  NA 132* 136  --  137  K 3.8 4.2  --  3.9  CL 99 101  --  99  CO2 21* 23  --  25  GLUCOSE 154* 153*  --  170*  BUN 10 10  --  19  CREATININE 0.99 0.77  --  0.78  CALCIUM 8.4* 8.8*  --  8.8*  MG  --   --  2.4  --    GFR: Estimated Creatinine Clearance: 93.3 mL/min (by C-G formula based on SCr of 0.78 mg/dL). Recent Labs  Lab Dec 19, 2019 1608 12-19-2019 1900 11/22/19 0853 11/23/19 0500  PROCALCITON 0.22  --   --   --   WBC 8.9  --  7.9 14.0*  LATICACIDVEN 2.5* 1.7  --   --     Liver Function Tests: Recent Labs  Lab 2019-12-19 1608 11/22/19 0853 11/23/19 0500  AST 58* 42* 40  ALT 30 30 29   ALKPHOS 47 49 55  BILITOT 1.3* 0.5 0.6  PROT 6.5 6.7 6.7  ALBUMIN 2.8* 2.6* 2.5*   No results for input(s): LIPASE, AMYLASE in the last 168 hours. No results for input(s): AMMONIA in the last 168 hours.  ABG    Component Value Date/Time   PHART 7.434 11/23/2019 2007   PCO2ART 41.6 11/23/2019 2007   PO2ART 47.0 (L) 11/23/2019 2007   HCO3 27.4 11/23/2019 2007   O2SAT 81.1 11/23/2019 2007     Coagulation Profile: No results for input(s): INR, PROTIME in the last 168 hours.  Cardiac Enzymes: No results for input(s): CKTOTAL, CKMB, CKMBINDEX,  TROPONINI in the last 168 hours.  HbA1C: No results found for: HGBA1C  CBG: Recent Labs  Lab 11/23/19 2247  GLUCAP 210*    Review of Systems:   Negative except as noted in HPI  Past Medical History  He,  has a past medical history of Arthritis and Urinary frequency.   Surgical History    Past Surgical History:  Procedure Laterality Date  . APPENDECTOMY    . KNEE ARTHROPLASTY Right   . TONSILLECTOMY    . 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 He has never used  smokeless tobacco. He reports that he does not drink alcohol or use drugs.   Family History   His family history is not on file.   Allergies No Known Allergies   Home Medications  Prior to Admission medications   Medication Sig Start Date End Date Taking? Authorizing Provider  acetaminophen (TYLENOL) 500 MG tablet Take 1,000 mg by mouth every 6 (six) hours as needed for mild pain, fever or headache.   Yes [provider]  ascorbic acid (VITAMIN C) 500 MG tablet Take 500-1,000 mg by mouth daily.   Yes [provider]  aspirin 81 MG EC tablet Take 81 mg by mouth daily.   Yes [provider]  naproxen sodium (ALEVE) 220 MG tablet Take 220-440 mg by mouth 2 (two) times daily as needed (for arthritis pain).    Yes [provider]  Phenyleph-Doxylamine-DM-APAP (TYLENOL COLD/FLU/COUGH NIGHT) 5-6.25-10-325 MG/15ML LIQD Take 20 mLs by mouth every 4 (four) hours as needed (for flu-like symptoms).    Yes [provider]  trimethoprim-polymyxin b (POLYTRIM) ophthalmic solution Place 1 drop into the left eye every 4 (four) hours. Patient not taking: Reported on 10/27/2019 04/11/17   Lysbeth Penner, FNP     Critical care time: 60 minutes       CRITICAL  CARE Performed by: Jacalyn Lefevre   Total critical care time: 60 minutes  Critical care time was exclusive of separately billable procedures and treating other patients.  Critical care was necessary to treat or prevent imminent or life-threatening deterioration.  Critical care was time spent personally by me on the following activities: development of treatment plan with patient and/or surrogate as well as nursing, discussions with consultants, evaluation of patient's response to treatment, examination of patient, obtaining history from patient or surrogate, ordering and performing treatments and interventions, ordering and review of laboratory studies, ordering and review of radiographic studies, pulse oximetry and re-evaluation of patient's condition.   Jacalyn Lefevre, MD

## 2019-11-23 NOTE — Progress Notes (Signed)
Pharmacy Antibiotic Note  Matthew Mendez is a 79 y.o. male admitted on Nov 26, 2019 with COVID-19.  Pharmacy has been consulted for cefepime dosing. Patient had blood cultures from 1/28 grow gram positive rods in 1/2 cultures in the anaerobic bottle only. WBC is elevated at 14 in the setting on steroids and patient is afebrile. Cultures could likely represent contaminant.   Plan: - Start cefepime 2g IV q8hr  - F/u blood cultures, renal function, and clinical improvement  Height: 5\' 8"  (172.7 cm) Weight: 244 lb 0.8 oz (110.7 kg) IBW/kg (Calculated) : 68.4  Temp (24hrs), Avg:98.1 F (36.7 C), Min:97.6 F (36.4 C), Max:98.9 F (37.2 C)  Recent Labs  Lab 11-26-19 1608 11/26/19 1900 11/22/19 0853 11/23/19 0500  WBC 8.9  --  7.9 14.0*  CREATININE 0.99  --  0.77 0.78  LATICACIDVEN 2.5* 1.7  --   --     Estimated Creatinine Clearance: 93.3 mL/min (by C-G formula based on SCr of 0.78 mg/dL).    No Known Allergies  Antimicrobials this admission: Cefepime 1/30 >>    Microbiology results: 1/28 BCx x2: Gram positive rods 1/4 bottles (anaerobic only)  Thank you for allowing pharmacy to be a part of this patient's care.  2/28, PharmD PGY1 Pharmacy Resident

## 2019-11-24 ENCOUNTER — Inpatient Hospital Stay (HOSPITAL_COMMUNITY): Payer: Medicare HMO

## 2019-11-24 DIAGNOSIS — R7989 Other specified abnormal findings of blood chemistry: Secondary | ICD-10-CM

## 2019-11-24 DIAGNOSIS — I2609 Other pulmonary embolism with acute cor pulmonale: Secondary | ICD-10-CM

## 2019-11-24 DIAGNOSIS — I50810 Right heart failure, unspecified: Secondary | ICD-10-CM

## 2019-11-24 DIAGNOSIS — J9601 Acute respiratory failure with hypoxia: Secondary | ICD-10-CM

## 2019-11-24 DIAGNOSIS — R570 Cardiogenic shock: Secondary | ICD-10-CM

## 2019-11-24 DIAGNOSIS — Z9911 Dependence on respirator [ventilator] status: Secondary | ICD-10-CM

## 2019-11-24 DIAGNOSIS — U071 COVID-19: Secondary | ICD-10-CM

## 2019-11-24 LAB — POCT I-STAT 7, (LYTES, BLD GAS, ICA,H+H)
Acid-base deficit: 9 mmol/L — ABNORMAL HIGH (ref 0.0–2.0)
Acid-base deficit: 9 mmol/L — ABNORMAL HIGH (ref 0.0–2.0)
Acid-base deficit: 6 mmol/L — ABNORMAL HIGH (ref 0.0–2.0)
Acid-base deficit: 4 mmol/L — ABNORMAL HIGH (ref 0.0–2.0)
Bicarbonate: 25 mmol/L (ref 20.0–28.0)
Bicarbonate: 23.3 mmol/L (ref 20.0–28.0)
Bicarbonate: 24.7 mmol/L (ref 20.0–28.0)
Bicarbonate: 22.8 mmol/L (ref 20.0–28.0)
Calcium, Ion: 1.19 mmol/L (ref 1.15–1.40)
Calcium, Ion: 1.12 mmol/L — ABNORMAL LOW (ref 1.15–1.40)
Calcium, Ion: 1.06 mmol/L — ABNORMAL LOW (ref 1.15–1.40)
Calcium, Ion: 1.02 mmol/L — ABNORMAL LOW (ref 1.15–1.40)
HCT: 49 % (ref 39.0–52.0)
HCT: 49 % (ref 39.0–52.0)
HCT: 44 % (ref 39.0–52.0)
HCT: 43 % (ref 39.0–52.0)
Hemoglobin: 16.7 g/dL (ref 13.0–17.0)
Hemoglobin: 16.7 g/dL (ref 13.0–17.0)
Hemoglobin: 15 g/dL (ref 13.0–17.0)
Hemoglobin: 14.6 g/dL (ref 13.0–17.0)
O2 Saturation: 98 %
O2 Saturation: 99 %
O2 Saturation: 99 %
O2 Saturation: 97 %
Patient temperature: 97.6
Patient temperature: 97.8
Patient temperature: 97.8
Patient temperature: 98.6
Potassium: 3.4 mmol/L — ABNORMAL LOW (ref 3.5–5.1)
Potassium: 3.4 mmol/L — ABNORMAL LOW (ref 3.5–5.1)
Potassium: 3.7 mmol/L (ref 3.5–5.1)
Potassium: 4.5 mmol/L (ref 3.5–5.1)
Sodium: 139 mmol/L (ref 135–145)
Sodium: 142 mmol/L (ref 135–145)
Sodium: 144 mmol/L (ref 135–145)
Sodium: 142 mmol/L (ref 135–145)
TCO2: 28 mmol/L (ref 22–32)
TCO2: 26 mmol/L (ref 22–32)
TCO2: 27 mmol/L (ref 22–32)
TCO2: 24 mmol/L (ref 22–32)
pCO2 arterial: 92.2 mmHg (ref 32.0–48.0)
pCO2 arterial: 74.7 mmHg (ref 32.0–48.0)
pCO2 arterial: 68.8 mmHg (ref 32.0–48.0)
pCO2 arterial: 49.2 mmHg — ABNORMAL HIGH (ref 32.0–48.0)
pH, Arterial: 7.037 — CL (ref 7.350–7.450)
pH, Arterial: 7.099 — CL (ref 7.350–7.450)
pH, Arterial: 7.16 — CL (ref 7.350–7.450)
pH, Arterial: 7.274 — ABNORMAL LOW (ref 7.350–7.450)
pO2, Arterial: 148 mmHg — ABNORMAL HIGH (ref 83.0–108.0)
pO2, Arterial: 161 mmHg — ABNORMAL HIGH (ref 83.0–108.0)
pO2, Arterial: 190 mmHg — ABNORMAL HIGH (ref 83.0–108.0)
pO2, Arterial: 101 mmHg (ref 83.0–108.0)

## 2019-11-24 LAB — PREPARE FRESH FROZEN PLASMA: Unit division: 0

## 2019-11-24 LAB — CBC
HCT: 46.6 % (ref 39.0–52.0)
HCT: 49.5 % (ref 39.0–52.0)
HCT: 46.8 % (ref 39.0–52.0)
Hemoglobin: 14.5 g/dL (ref 13.0–17.0)
Hemoglobin: 16 g/dL (ref 13.0–17.0)
Hemoglobin: 15.2 g/dL (ref 13.0–17.0)
MCH: 31.3 pg (ref 26.0–34.0)
MCH: 31.9 pg (ref 26.0–34.0)
MCH: 31.7 pg (ref 26.0–34.0)
MCHC: 31.1 g/dL (ref 30.0–36.0)
MCHC: 32.3 g/dL (ref 30.0–36.0)
MCHC: 32.5 g/dL (ref 30.0–36.0)
MCV: 100.6 fL — ABNORMAL HIGH (ref 80.0–100.0)
MCV: 98.8 fL (ref 80.0–100.0)
MCV: 97.7 fL (ref 80.0–100.0)
Platelets: 260 10*3/uL (ref 150–400)
Platelets: 196 10*3/uL (ref 150–400)
Platelets: 167 K/uL (ref 150–400)
RBC: 4.63 MIL/uL (ref 4.22–5.81)
RBC: 5.01 MIL/uL (ref 4.22–5.81)
RBC: 4.79 MIL/uL (ref 4.22–5.81)
RDW: 13.6 % (ref 11.5–15.5)
RDW: 13.8 % (ref 11.5–15.5)
RDW: 14 % (ref 11.5–15.5)
WBC: 28.7 10*3/uL — ABNORMAL HIGH (ref 4.0–10.5)
WBC: 28.1 10*3/uL — ABNORMAL HIGH (ref 4.0–10.5)
WBC: 22.9 K/uL — ABNORMAL HIGH (ref 4.0–10.5)
nRBC: 0.1 % (ref 0.0–0.2)
nRBC: 0.4 % — ABNORMAL HIGH (ref 0.0–0.2)
nRBC: 0.4 % — ABNORMAL HIGH (ref 0.0–0.2)

## 2019-11-24 LAB — BASIC METABOLIC PANEL
Anion gap: 17 — ABNORMAL HIGH (ref 5–15)
BUN: 50 mg/dL — ABNORMAL HIGH (ref 8–23)
CO2: 21 mmol/L — ABNORMAL LOW (ref 22–32)
Calcium: 7.4 mg/dL — ABNORMAL LOW (ref 8.9–10.3)
Chloride: 106 mmol/L (ref 98–111)
Creatinine, Ser: 2.4 mg/dL — ABNORMAL HIGH (ref 0.61–1.24)
GFR calc Af Amer: 29 mL/min — ABNORMAL LOW (ref >60)
GFR calc non Af Amer: 25 mL/min — ABNORMAL LOW (ref >60)
Glucose, Bld: 195 mg/dL — ABNORMAL HIGH (ref 70–99)
Potassium: 4.7 mmol/L (ref 3.5–5.1)
Sodium: 144 mmol/L (ref 135–145)

## 2019-11-24 LAB — COMPREHENSIVE METABOLIC PANEL
ALT: 36 U/L (ref 0–44)
AST: 59 U/L — ABNORMAL HIGH (ref 15–41)
Albumin: 2.8 g/dL — ABNORMAL LOW (ref 3.5–5.0)
Alkaline Phosphatase: 99 U/L (ref 38–126)
Anion gap: 22 — ABNORMAL HIGH (ref 5–15)
BUN: 31 mg/dL — ABNORMAL HIGH (ref 8–23)
CO2: 20 mmol/L — ABNORMAL LOW (ref 22–32)
Calcium: 8.8 mg/dL — ABNORMAL LOW (ref 8.9–10.3)
Chloride: 102 mmol/L (ref 98–111)
Creatinine, Ser: 2.36 mg/dL — ABNORMAL HIGH (ref 0.61–1.24)
GFR calc Af Amer: 30 mL/min — ABNORMAL LOW (ref >60)
GFR calc non Af Amer: 26 mL/min — ABNORMAL LOW (ref >60)
Glucose, Bld: 337 mg/dL — ABNORMAL HIGH (ref 70–99)
Potassium: 3.7 mmol/L (ref 3.5–5.1)
Sodium: 144 mmol/L (ref 135–145)
Total Bilirubin: 1.3 mg/dL — ABNORMAL HIGH (ref 0.3–1.2)
Total Protein: 7.3 g/dL (ref 6.5–8.1)

## 2019-11-24 LAB — GLUCOSE, CAPILLARY
Glucose-Capillary: 167 mg/dL — ABNORMAL HIGH (ref 70–99)
Glucose-Capillary: 187 mg/dL — ABNORMAL HIGH (ref 70–99)
Glucose-Capillary: 233 mg/dL — ABNORMAL HIGH (ref 70–99)
Glucose-Capillary: 255 mg/dL — ABNORMAL HIGH (ref 70–99)
Glucose-Capillary: 299 mg/dL — ABNORMAL HIGH (ref 70–99)
Glucose-Capillary: 302 mg/dL — ABNORMAL HIGH (ref 70–99)

## 2019-11-24 LAB — MAGNESIUM: Magnesium: 2.8 mg/dL — ABNORMAL HIGH (ref 1.7–2.4)

## 2019-11-24 LAB — BPAM FFP
Blood Product Expiration Date: 202101311245
ISSUE DATE / TIME: 202101301416
Unit Type and Rh: 9500

## 2019-11-24 LAB — APTT: aPTT: 49 seconds — ABNORMAL HIGH (ref 24–36)

## 2019-11-24 LAB — TRIGLYCERIDES: Triglycerides: 164 mg/dL — ABNORMAL HIGH (ref <150)

## 2019-11-24 LAB — HEMOGLOBIN A1C
Hgb A1c MFr Bld: 6.1 % — ABNORMAL HIGH (ref 4.8–5.6)
Mean Plasma Glucose: 128.37 mg/dL

## 2019-11-24 LAB — CULTURE, BLOOD (ROUTINE X 2)

## 2019-11-24 LAB — PROTIME-INR
INR: 2.8 — ABNORMAL HIGH (ref 0.8–1.2)
Prothrombin Time: 29.7 seconds — ABNORMAL HIGH (ref 11.4–15.2)

## 2019-11-24 LAB — D-DIMER, QUANTITATIVE: D-Dimer, Quant: >20 ug/mL-FEU — ABNORMAL HIGH (ref 0.00–0.50)

## 2019-11-24 LAB — C-REACTIVE PROTEIN: CRP: 6.3 mg/dL — ABNORMAL HIGH (ref <1.0)

## 2019-11-24 LAB — HEPARIN LEVEL (UNFRACTIONATED): Heparin Unfractionated: 0.41 IU/mL (ref 0.30–0.70)

## 2019-11-24 LAB — MRSA PCR SCREENING: MRSA by PCR: NEGATIVE

## 2019-11-24 LAB — LACTIC ACID, PLASMA: Lactic Acid, Venous: 4.1 mmol/L (ref 0.5–1.9)

## 2019-11-24 LAB — FERRITIN: Ferritin: 1221 ng/mL — ABNORMAL HIGH (ref 24–336)

## 2019-11-24 MED ORDER — NOREPINEPHRINE 16 MG/250ML-% IV SOLN
0.0000 ug/min | INTRAVENOUS | Status: DC
  Administered 2019-11-25: 01:00:00 40 ug/min via INTRAVENOUS
  Administered 2019-11-25: 50 ug/min via INTRAVENOUS
  Filled 2019-11-24 (×4): qty 250

## 2019-11-24 MED ORDER — ORAL CARE MOUTH RINSE
15.0000 mL | OROMUCOSAL | Status: DC
  Administered 2019-11-24 – 2019-11-25 (×14): 15 mL via OROMUCOSAL

## 2019-11-24 MED ORDER — NOREPINEPHRINE 4 MG/250ML-% IV SOLN
0.0000 ug/min | INTRAVENOUS | Status: DC
  Administered 2019-11-24: 2 ug/min via INTRAVENOUS
  Administered 2019-11-24: 20 ug/min via INTRAVENOUS
  Administered 2019-11-24: 25 ug/min via INTRAVENOUS
  Filled 2019-11-24 (×8): qty 250

## 2019-11-24 MED ORDER — SODIUM CHLORIDE 0.9 % IV BOLUS: 500.0000 mL | Freq: Once | INTRAVENOUS | Status: DC

## 2019-11-24 MED ORDER — SODIUM CHLORIDE 0.9 % IV SOLN: INTRAVENOUS | Status: DC | PRN

## 2019-11-24 MED ORDER — SODIUM CHLORIDE 0.9 % IV SOLN
250.0000 mL | Freq: Once | INTRAVENOUS | Status: AC
  Administered 2019-11-24: 250 mL via INTRAVENOUS

## 2019-11-24 MED ORDER — SODIUM CHLORIDE 0.9 % IV BOLUS
1000.0000 mL | Freq: Once | INTRAVENOUS | Status: AC
  Administered 2019-11-24: 1000 mL via INTRAVENOUS

## 2019-11-24 MED ORDER — VASOPRESSIN 20 UNIT/ML IV SOLN
0.0300 [IU]/min | INTRAVENOUS | Status: DC
  Administered 2019-11-24: 0.03 [IU]/min via INTRAVENOUS
  Filled 2019-11-24 (×2): qty 2

## 2019-11-24 MED ORDER — CHLORHEXIDINE GLUCONATE 0.12% ORAL RINSE (MEDLINE KIT)
15.0000 mL | Freq: Two times a day (BID) | OROMUCOSAL | Status: DC
  Administered 2019-11-24 – 2019-11-25 (×4): 15 mL via OROMUCOSAL

## 2019-11-24 MED ORDER — INSULIN ASPART 100 UNIT/ML ~~LOC~~ SOLN
0.0000 [IU] | SUBCUTANEOUS | Status: DC
  Administered 2019-11-24: 5 [IU] via SUBCUTANEOUS
  Administered 2019-11-24: 3 [IU] via SUBCUTANEOUS
  Administered 2019-11-25: 08:00:00 8 [IU] via SUBCUTANEOUS
  Administered 2019-11-25: 2 [IU] via SUBCUTANEOUS
  Administered 2019-11-25: 3 [IU] via SUBCUTANEOUS

## 2019-11-24 MED ORDER — SODIUM CHLORIDE 0.9 % IV SOLN
2.0000 g | Freq: Two times a day (BID) | INTRAVENOUS | Status: DC
  Administered 2019-11-24: 2 g via INTRAVENOUS
  Filled 2019-11-24: qty 2

## 2019-11-24 MED ORDER — ALTEPLASE (PULMONARY EMBOLISM) INFUSION
100.0000 mg | Freq: Once | INTRAVENOUS | Status: AC
  Administered 2019-11-24: 100 mg via INTRAVENOUS
  Filled 2019-11-24: qty 100

## 2019-11-24 MED ORDER — FAMOTIDINE IN NACL 20-0.9 MG/50ML-% IV SOLN
20.0000 mg | INTRAVENOUS | Status: DC
  Administered 2019-11-24: 20 mg via INTRAVENOUS
  Filled 2019-11-24: qty 50

## 2019-11-24 MED ORDER — DEXMEDETOMIDINE HCL IN NACL 400 MCG/100ML IV SOLN
0.4000 ug/kg/h | INTRAVENOUS | Status: DC
  Administered 2019-11-24: 0.4 ug/kg/h via INTRAVENOUS
  Administered 2019-11-24: 0.6 ug/kg/h via INTRAVENOUS
  Filled 2019-11-24 (×3): qty 100

## 2019-11-24 MED ORDER — HEPARIN (PORCINE) 25000 UT/250ML-% IV SOLN
1050.0000 [IU]/h | INTRAVENOUS | Status: DC
  Administered 2019-11-24: 1150 [IU]/h via INTRAVENOUS
  Filled 2019-11-24: qty 250

## 2019-11-24 MED ORDER — "THROMBI-PAD 3""X3"" EX PADS"
1.0000 | MEDICATED_PAD | Freq: Once | CUTANEOUS | Status: AC
  Administered 2019-11-25: 1 via TOPICAL
  Filled 2019-11-24: qty 1

## 2019-11-24 MED ORDER — VANCOMYCIN HCL 2000 MG/400ML IV SOLN
2000.0000 mg | INTRAVENOUS | Status: DC
  Administered 2019-11-24: 2000 mg via INTRAVENOUS
  Filled 2019-11-24: qty 400

## 2019-11-24 NOTE — Progress Notes (Signed)
PCCM:  Patient with continued further decompensation.  Remains severely hypoxemic on full mechanical support.  Laboratory results reviewed.  Arterial blood gas with respiratory acidosis, elevated PCO2 in the setting of adequate minute ventilation, elevated D-dimer greater than 20.  Chest x-ray reviewed with minimal infiltrates that do not explain severity of hypoxemia.  Patient with progressive shocklike state cyanotic lower extremities consistent with cardiogenic shock.  Decision made for bedside ultrasound.  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  Case discussed via FaceTime with cardiology.  There is additional mild septal bowing into the LV.  Presumed diagnosis of acute pulmonary embolism.  Decision made for urgent TPA administration.  100 mg of IV TPA ordered.  Discussed risk benefits and alternatives with patient's daughter via phone.  She understands he has acute decompensated state.  She is an ICU nurse herself.  Josephine Igo, DO [facility] 11/24/2019 11:39 AM

## 2019-11-24 NOTE — Progress Notes (Addendum)
eLink Physician-Brief Progress Note Patient Name: Matthew Mendez DOB: 11/27/1940 MRN: [id number]   Date of Service  11/24/2019  HPI/Events of Note  ABG and BMP noted Clarification - patient was on 71 mic of levophed already when RN started her shift MAP in 70s now. CVP is 7-8  eICU Interventions  1 liter NS (o2 sat is 96 on 80%/peep 15) CBC and lactate at 12.30 am RN will notify us results, discussed over cam      Intervention Category Intermediate Interventions: Diagnostic test evaluation;Other:  Oretha Milch 11/24/2019, 10:50 PM   Addendum ABG improved from before  RN also palpated patient's belly on camera  NO wincing, and is soft all over  RN is going to increase fentanyl drip for vent synchrony

## 2019-11-24 NOTE — Progress Notes (Signed)
Pharmacy Antibiotic Note  Matthew Mendez is a 79 y.o. male admitted on December 16, 2019 with COVID 19 and respiratory distress.  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 WBC 22, Cr 0.9>2.3 Crcl 41ml/min, currently treated with cefepime. Pharmacy has been consulted for adding Vancomycin dosing.  Plan: Vancomycin 2gm IV q48h  AUC est 480 Cefepime 2gm q12h  Height: 5\' 8"  (172.7 cm) Weight: 244 lb 0.8 oz (110.7 kg) IBW/kg (Calculated) : 68.4  Temp (24hrs), Avg:97.1 F (36.2 C), Min:96.3 F (35.7 C), Max:97.8 F (36.6 C)  Recent Labs  Lab Dec 16, 2019 1608 Dec 16, 2019 1608 12-16-19 1900 11/22/19 0853 11/23/19 0500 11/24/19 0446 11/24/19 0550 11/24/19 1326 11/24/19 1942  WBC 8.9   < >  --  7.9 14.0* 28.7*  --  28.1* 22.9*  CREATININE 0.99  --   --  0.77 0.78  --  2.36*  --   --   LATICACIDVEN 2.5*  --  1.7  --   --   --   --   --  4.1*   < > = values in this interval not displayed.    Estimated Creatinine Clearance: 31.6 mL/min (A) (by C-G formula based on SCr of 2.36 mg/dL (H)).    No Known Allergies  Antimicrobials this admission:   Dose adjustments this admission:   Microbiology results: 1/31 respi Cx ip 1/28 Bcx actinomyces neuii  2/28 Pharm.D. CPP, BCPS Clinical Pharmacist [phone] 11/24/2019 8:42 PM

## 2019-11-24 NOTE — Progress Notes (Addendum)
1017: MD Icard at bedside to assess patient.   10:37 MD Icard spoke with daughter about plan of care.  10:44 of fentanyl bolused for femoral line placement  11:30 Daughter at bedside visiting. Patients wife called by RN and placed on speaker phone.   1410: Son at bedside visiting.  1700: MD Icard notified patients LLE more dusky, R femoral site oozing. Pulses heard with doppler, thrombi pad ordered. Will continue to monitor.   19:16: E link called. Patient on of levophed. Will place new orders. Notified patient is hemodynamically unstable, therefore head CT cannot be completed at this time.   1945: Daughter Deanna updated on patient condition. Notified patient requiring increased vasopressor support and has had some bleeding. Notified night MD placed new orders for CBC and lactate - will wait for result. Notified patient is not stable at this time to complete ordered head CT. Daughter is aware of severity of patients illness. Per conversation, daughter does not want prolonged ACLS if patient were to code. Please notify daughter throughout the night if patient continues to decline.

## 2019-11-24 NOTE — Progress Notes (Signed)
NAME:  Matthew Mendez, MRN:  [id number], DOB:  [birth year], LOS: 3 ADMISSION DATE:  18-Dec-2019, CONSULTATION DATE:  11/24/19 REFERRING MD:  Jerolyn Center, CHIEF COMPLAINT:  Hypoxic respiratory failure   Brief History   79 y.o. M with PMH of osteoarthritis who was diagnosed with Covid-19 and admitted with pneumonia on 1/28.   In the evenong of 1/29 patient had an episode of confusion, hypoxia and new onset atrial fibrillation and required increasing oxygen supplementation.  Converted to NSR. Then, on the evening of 1/30, patient became acute hypoxic despite HFNC and NRB support, was transferred to the ICU and intubated.    History of present illness   Mr. Matthew Mendez is a 79 y.o. M with PMH of arthritis who lives at home who was diagnosed with Covid-19 approximately 1/22 and presented to the ED on 1/28 for shortness of breath and found to have Covid-19 pneumonia.  He was admitted and started on Remdesevir, Dexamethasone and received Actemra.  CT scan chest on admission showed bilateral extensive ground glass opacities without PE.  Had increased WOB and hypoxia today, unable to get sats >81%. Increased opacities on chest x-ray today, particularly in RUL fields.  Intubated after transfer to the MICU.  Of note patient did receive convalescent plasma this afternoon around 1500.     Past Medical History   has a past medical history of Arthritis and Urinary frequency.   Significant Hospital Events   1/28  Admit to Hospitalists 1/29 PCCM consult, Afib event, converted to NSR 1/30, transferred to MICU for significant hypoxia, intubated.     Consults:  PCCM  Procedures:  Intubated 1/30  Significant Diagnostic Tests:  1/28 CT chest>>Extensive ground-glass opacities throughout both lungs,compatible with multifocal pneumonia in the setting of known COVID19 infection. 1/29 CXR>>Improving bilateral pulmonary infiltrates, most focal in the right upper lobe. 1/30 CXR>> worsening infiltrates  Micro Data:    1/28 Sars-CoV-2>>positive 1/28 BCx2>>  Antimicrobials:  Remdesevir 1/28- Cefepime 1/30  Interim history/subjective:  Patient with progressive hypoxemia.  Still on 100% mechanical ventilation.  PCO2 remains elevated.  Bedside ultrasound with large akinetic right ventricle.  Chest x-ray reviewed with minimal infiltrates present.  Concern for pulmonary embolism.  D-dimer greater than 20.  Cell phone used to FaceTime cardiologist and review bedside ultrasound images confirming right akinetic ventricle.  While in patient's room patient had brief bradycardic event heart rate dropped to low 40s.  And slowly recovered on his own.  Code cart was brought into room pads were placed on chest.  Patient remains on continuous norepinephrine infusion.  Objective   Blood pressure 103/73, pulse (!) 105, temperature 97.8 F (36.6 C), temperature source Oral, resp. rate (!) 35, height 5\' 8"  (1.727 m), weight 110.7 kg, SpO2 99 %.    Vent Mode: PRVC FiO2 (%):  [100 %] 100 % Set Rate:  [20 bmp-35 bmp] 35 bmp Vt Set:  [410 mL-450 mL] 450 mL PEEP:  [15 cmH20] 15 cmH20 Plateau Pressure:  [28 cmH20-31 cmH20] 31 cmH20   Intake/Output Summary (Last 24 hours) at 11/24/2019 1005 Last data filed at 11/24/2019 0800 Gross per 24 hour  Intake 847.72 ml  Output 540 ml  Net 307.72 ml   Filed Weights   12/18/19 2040  Weight: 110.7 kg    General appearance: 79 y.o., male, elderly male, intubated on mechanical life support Eyes: Anicteric, tracking appropriately pupils reactive HENT: NCAT, mucous membranes moist Neck: Trachea midline, endotracheal tube in place Lungs: Bilateral ventilated breath sounds CV: Regular rate rhythm,  S1-S2 Abdomen: Soft nontender mildly distended Extremities: No significant edema Psych: Sedated on mechanical ventilation Neuro: Grimace to pain, sedated on mechanical ventilation, withdrawals    Resolved Hospital Problem list     Assessment & Plan:    Acute Hypoxic Respiratory  failure secondary to bilateral Covid-19 Pneumonia, development of acute respiratory distress syndrome. Intubated 1/30, with improvement in sats.  Increasing infiltrates particularly on R side.  S/p tocilizumab 1/28, conv plasma 1/30 Severe acute respiratory acidosis P: Continue mechanical ventilation per ARDS protocol Target TVol 6-8cc/kgIBW Target Plateau Pressure < 30cm H20 Target driving pressure less than 15 cm of water Target PaO2 55-65: titrate PEEP/FiO2 per protocol As long as PaO2 to FiO2 ratio is less than 1:150 position in prone position for 16 hours a day Check CVP daily if CVL in place Target CVP less than 4, diurese as necessary Ventilator associated pneumonia prevention protocol Continue remdesivir plus steroids Remains on cefepime, respiratory cultures pending Patient's elevated CO2 in the setting of severe hypoxemia, hypotension, shock state, significantly elevated D-dimer would be concerned for pulmonary embolism.  Bedside ultrasound completed with akinetic right ventricle mild septal bowing into the LV.  Bilateral lower extremities with severe cyanosis and shocklike state.  Concerning for cardiogenic shock related to massive pulmonary embolism.  Patient too unstable for transfer to CTA.  Decision made for urgent vascular access and 100 mg IV TPA  New onset Atrial Fibrillation, now rate controlled P: -Repeat echo pending, holding anticoagulation  Acute renal failure, in setting of sepsis hypotension. -No acute indication for dialysis at this time. -Continue to observe. -Repeat BMP in a.m.  Elevated D-dimer -Bilateral lower extremity duplex -May need to consider oral anticoagulation for possible PE. -However chest x-ray does not explain severity of hypoxemia   Best practice:  Diet: regular  Pain/Anxiety/Delirium protocol (if indicated): n/a VAP protocol (if indicated): n/a DVT prophylaxis: Lovenox GI prophylaxis: n/a Glucose control: n/a,  Mobility: bed  rest Code Status: full code Family Communication: I called and spoke with Deanna patient's daughter and updated of his critical situation. Disposition: 57M ICU  Labs   CBC: Recent Labs  Lab 10/31/2019 1608 11/12/2019 1608 11/22/19 0853 11/22/19 0853 11/23/19 0500 11/23/19 2345 11/24/19 0308 11/24/19 0446 11/24/19 0810  WBC 8.9  --  7.9  --  14.0*  --   --  28.7*  --   NEUTROABS 7.5  --  6.7  --  12.1*  --   --   --   --   HGB 14.2   < > 14.8   < > 15.1 17.0 16.7 14.5 16.7  HCT 41.7   < > 43.7   < > 44.8 50.0 49.0 46.6 49.0  MCV 91.9  --  93.2  --  92.4  --   --  100.6*  --   PLT 184  --  171  --  258  --   --  260  --    < > = values in this interval not displayed.    Basic Metabolic Panel: Recent Labs  Lab 10/25/2019 1608 11/12/2019 1608 11/22/19 0853 11/22/19 0853 11/22/19 2212 11/23/19 0500 11/23/19 2345 11/24/19 0308 11/24/19 0550 11/24/19 0810  NA 132*   < > 136   < >  --  137 141 139 144 142  K 3.8   < > 4.2   < >  --  3.9 4.0 3.4* 3.7 3.4*  CL 99  --  101  --   --  99  --   --  102  --   CO2 21*  --  23  --   --  25  --   --  20*  --   GLUCOSE 154*  --  153*  --   --  170*  --   --  337*  --   BUN 10  --  10  --   --  19  --   --  31*  --   CREATININE 0.99  --  0.77  --   --  0.78  --   --  2.36*  --   CALCIUM 8.4*  --  8.8*  --   --  8.8*  --   --  8.8*  --   MG  --   --   --   --  2.4  --   --   --  2.8*  --    < > = values in this interval not displayed.   GFR: Estimated Creatinine Clearance: 31.6 mL/min (A) (by C-G formula based on SCr of 2.36 mg/dL (H)). Recent Labs  Lab 11/15/2019 1608 11/02/2019 1900 11/22/19 0853 11/23/19 0500 11/24/19 0446  PROCALCITON 0.22  --   --   --   --   WBC 8.9  --  7.9 14.0* 28.7*  LATICACIDVEN 2.5* 1.7  --   --   --     Liver Function Tests: Recent Labs  Lab 10/26/2019 1608 11/22/19 0853 11/23/19 0500 11/24/19 0550  AST 58* 42* 40 59*  ALT 30 30 29  36  ALKPHOS 47 49 55 99  BILITOT 1.3* 0.5 0.6 1.3*  PROT 6.5 6.7  6.7 7.3  ALBUMIN 2.8* 2.6* 2.5* 2.8*   No results for input(s): LIPASE, AMYLASE in the last 168 hours. No results for input(s): AMMONIA in the last 168 hours.  ABG    Component Value Date/Time   PHART 7.099 (LL) 11/24/2019 0810   PCO2ART 74.7 (HH) 11/24/2019 0810   PO2ART 161.0 (H) 11/24/2019 0810   HCO3 23.3 11/24/2019 0810   TCO2 26 11/24/2019 0810   ACIDBASEDEF 9.0 (H) 11/24/2019 0810   O2SAT 99.0 11/24/2019 0810     Coagulation Profile: No results for input(s): INR, PROTIME in the last 168 hours.  Cardiac Enzymes: No results for input(s): CKTOTAL, CKMB, CKMBINDEX, TROPONINI in the last 168 hours.  HbA1C: No results found for: HGBA1C  CBG: Recent Labs  Lab 11/23/19 2247 11/24/19 0310 11/24/19 0817  GLUCAP 210* 299* 302*    This patient is critically ill with multiple organ system failure; which, requires frequent high complexity decision making, assessment, support, evaluation, and titration of therapies. This was completed through the application of advanced monitoring technologies and extensive interpretation of multiple databases. During this encounter critical care time was devoted to patient care services described in this note for 65 minutes.  11/26/19, DO [facility] 11/24/2019 10:05 AM

## 2019-11-24 NOTE — Progress Notes (Addendum)
eLink Physician-Brief Progress Note Patient Name: Matthew Mendez DOB: 02/21/1941 MRN: [id number]   Date of Service  11/24/2019  HPI/Events of Note  Notified that patient has been hypotensive in the last couple hours . Now on max dose levophed. MAP ranges between 69-72, sometimes drops to 65. A prior ordered head CT from bedside has not been done due to hemodynamic instability. Patient on fentanyl and precedex and flails around when lowered. Is on cefepime.   eICU Interventions  Add vancomycin Check CBC and lactate stat RN notes oozing from groin line which is not new, but slow and continuous - got TPA and is on IV heparin Adding vasopressin if needed Asked RN to check CVP Please call when above are ready      Intervention Category Major Interventions: Hypotension - evaluation and management Evaluation Type: Other  Virgilio Broadhead G Ajla Mcgeachy 11/24/2019, 7:39 PM   8.55 pm Lactate noted, likely due to hypotension that has been prolonged  CBC with no signs suggestive of hemorrhage Check abg and a bmp as well CVP being checked

## 2019-11-24 NOTE — Progress Notes (Signed)
Pharmacy Antibiotic Note  Matthew Mendez is a 79 y.o. male admitted on 11/05/2019 with COVID-19.  Pharmacy has been consulted for cefepime dosing.  Blood cultures from 1/28 growing Actinomyces in 1/4 bottles.  Patient has an AKI, afebrile, WBC 28.7 (on steroid).  Plan: Reduce cefepime to 2gm IV Q12H Monitor renal fxn, clinical progress, micro data  Height: 5\' 8"  (172.7 cm) Weight: 244 lb 0.8 oz (110.7 kg) IBW/kg (Calculated) : 68.4  Temp (24hrs), Avg:97.7 F (36.5 C), Min:96.3 F (35.7 C), Max:98 F (36.7 C)  Recent Labs  Lab 10/31/2019 1608 11/23/2019 1900 11/22/19 0853 11/23/19 0500 11/24/19 0446 11/24/19 0550  WBC 8.9  --  7.9 14.0* 28.7*  --   CREATININE 0.99  --  0.77 0.78  --  2.36*  LATICACIDVEN 2.5* 1.7  --   --   --   --     Estimated Creatinine Clearance: 31.6 mL/min (A) (by C-G formula based on SCr of 2.36 mg/dL (H)).    No Known Allergies  Remdesivir 1/28 > 2/1 Actemra 1/29 Decadron 1/28 >>  Cefepime 1/30 >>  1/28 BCx - 1 of 4 Actinomyces  1/28 covid - positive 1/30 MRSA PCR - negative 1/31 BCx -  [name] D. 11-30-1981, PharmD, BCPS, BCCCP 11/24/2019, 1:13 PM

## 2019-11-24 NOTE — Procedures (Addendum)
Central Venous Catheter Insertion Procedure Note KLEBER CREAN [id number] 1941/04/24  Procedure: Insertion of Central Venous Catheter Indications: Assessment of intravascular volume and Drug and/or fluid administration  Procedure Details Consent: Unable to obtain consent because of emergent medical necessity.  Discussed with patient's daughter via phone. Time Out: Verified patient identification, verified procedure, site/side was marked, verified correct patient position, special equipment/implants available, medications/allergies/relevent history reviewed, required imaging and test results available.  Performed  Maximum sterile technique was used including antiseptics, cap, gloves, gown, hand hygiene, mask and sheet. Skin prep: Chlorhexidine; local anesthetic administered Ultrasound was used for guided vascular access into the right femoral vein.  Wire was confirmed with vascular ultrasound to be in appropriate position. A antimicrobial bonded/coated triple lumen catheter was placed in the right femoral vein due to emergent situation, too unstable to lay back, using the Seldinger technique.  Evaluation Blood flow good Complications: No apparent complications Patient did tolerate procedure well. Chest X-ray ordered to verify placement.  CXR: not necessary .  Rachel Bo Harrison Zetina 11/24/2019, 11:33 AM

## 2019-11-24 NOTE — Procedures (Signed)
Arterial Catheter Insertion Procedure Note JAVEION CANNEDY [id number] December 09, 1940  Procedure: Insertion of Arterial Catheter  Indications: Blood pressure monitoring and Frequent blood sampling  Procedure Details Consent: Risks of procedure as well as the alternatives and risks of each were explained to the (patient/caregiver).  Consent for procedure obtained. and Unable to obtain consent because of emergent medical necessity. Time Out: Verified patient identification, verified procedure, site/side was marked, verified correct patient position, special equipment/implants available, medications/allergies/relevent history reviewed, required imaging and test results available.  Performed  Maximum sterile technique was used including antiseptics, cap, gloves, gown, hand hygiene, mask and sheet. Skin prep: Chlorhexidine; local anesthetic administered 22 gauge catheter was inserted into left radial artery using the Seldinger technique. ULTRASOUND GUIDANCE USED: YES Evaluation Blood flow good; BP tracing good. Complications: No apparent complications.   Leafy Half 11/24/2019

## 2019-11-24 NOTE — Progress Notes (Signed)
Attempted to insert OG tube, unable to advance tube past mouth. Second RN attempted to insert without success.

## 2019-11-24 NOTE — Progress Notes (Signed)
Paged by bedside RN regarding the pts O2 sats in the 70's. Pt was on HHFNC at 60L and 100% FiO2 and on a nonrebreather at 15L. He is alert but disoriented to time and situation. Also attempting to get OOB and becomes agitated easily. LS are coarse and rhonchus bilaterally. Tachypneic into the 40's with accessory muscle use noted.   Ordered a CXR that showed " Worsening multifocal airspace opacities with compared to prior study. Findings are consistent with the patient's history of viral pneumonia. Underlying pulmonary edema is difficult to exclude in this patient." Ordered Lasix 40mg  IV x1 and Ativan 1mg  IV x1. Pts O2 increased to 70L HHFNC and 15L nonrebreather with O2 sats maintaining between 82-86%. ABG obtained showing a pH:7.43, pCO2: 41.6, pO2: 47, O2 sat 81.1. Consulted PCCM regarding pts persistent hypoxia despite intervention and increased O2 demands.  APRN-C [facility] Pager [phone]

## 2019-11-24 NOTE — Progress Notes (Signed)
Called [facility] and spoke to [employer] regarding pt's lactic resulting in 4.1, hemoglobin has not dropped. Pt's blood pressure remain soft on the vasopressin and femoral site is still oozing despite thrombi pad, and blood was also suctioned from pt's mouth. This RN also reported that pt has produced very little urine output, dark amber color. Vinnie Langton reports more lab orders will be placed and to continue to monitor to report any changes.

## 2019-11-24 NOTE — Progress Notes (Signed)
ANTICOAGULATION CONSULT NOTE  Pharmacy Consult:  Heparin Indication:  Rule out PE  No Known Allergies  Patient Measurements: Height: 5\' 8"  (172.7 cm) Weight: 244 lb 0.8 oz (110.7 kg) IBW/kg (Calculated) : 68.4 Heparin Dosing Weight: 93 kg  Vital Signs: Temp: 97.8 F (36.6 C) (01/31 0754) Temp Source: Oral (01/31 0754) BP: 111/81 (01/31 1400) Pulse Rate: 87 (01/31 1400)  Labs: Recent Labs    11/22/19 0853 11/22/19 0853 11/23/19 0500 11/23/19 2345 11/24/19 0446 11/24/19 0446 11/24/19 0550 11/24/19 0810 11/24/19 1326  HGB 14.8   < > 15.1   < > 14.5   < >  --  16.7 16.0  HCT 43.7   < > 44.8   < > 46.6  --   --  49.0 49.5  PLT 171   < > 258  --  260  --   --   --  196  APTT  --   --   --   --   --   --   --   --  49*  LABPROT  --   --   --   --   --   --   --   --  29.7*  INR  --   --   --   --   --   --   --   --  2.8*  CREATININE 0.77  --  0.78  --   --   --  2.36*  --   --    < > = values in this interval not displayed.    Estimated Creatinine Clearance: 31.6 mL/min (A) (by C-G formula based on SCr of 2.36 mg/dL (H)).   Assessment: 73 YOM presented with SOB and Covid-19.  Patient was transferred to the ICU on 11/23/19 PM due to acute hypoxia requiring intubation.  He continues to have increased WOB and d-dimer increased to > 20.  Alteplase was given for rule out PE and Pharmacy consulted to initiate IV heparin since aPTT post tPA is less than 80 seconds.  INR is elevated at 2.8; however, with possible acute PE, will proceed with starting IV heparin.  Goal of Therapy:  Heparin level 0.3 - 0.5 units/ml for 24 hrs post tPA Monitor platelets by anticoagulation protocol: Yes   Plan:  D/C Lovenox Start heparin gtt at 1150 units/hr, no bolus post tPA Check 8 hr heparin level Daily heparin level and CBC F/U Doppler results  Dorris Vangorder D. [birth year]-30-1982, PharmD, BCPS, BCCCP 11/24/2019, 2:32 PM

## 2019-11-24 NOTE — Progress Notes (Signed)
VASCULAR LAB PRELIMINARY  PRELIMINARY  PRELIMINARY  PRELIMINARY  Bilateral lower extremity venous duplex completed.    Preliminary report:  See CV proc for preliminary results.   Scharlene Catalina, RVT 11/24/2019, 1:01 PM

## 2019-11-25 LAB — CBC WITH DIFFERENTIAL/PLATELET
Abs Immature Granulocytes: 0.42 10*3/uL — ABNORMAL HIGH (ref 0.00–0.07)
Abs Immature Granulocytes: 0.53 10*3/uL — ABNORMAL HIGH (ref 0.00–0.07)
Basophils Absolute: 0.1 10*3/uL (ref 0.0–0.1)
Basophils Absolute: 0.1 10*3/uL (ref 0.0–0.1)
Basophils Relative: 0 %
Basophils Relative: 0 %
Eosinophils Absolute: 0 10*3/uL (ref 0.0–0.5)
Eosinophils Absolute: 0.1 10*3/uL (ref 0.0–0.5)
Eosinophils Relative: 0 %
Eosinophils Relative: 0 %
HCT: 41.7 % (ref 39.0–52.0)
HCT: 42.3 % (ref 39.0–52.0)
Hemoglobin: 13 g/dL (ref 13.0–17.0)
Hemoglobin: 13.3 g/dL (ref 13.0–17.0)
Immature Granulocytes: 2 %
Immature Granulocytes: 2 %
Lymphocytes Relative: 10 %
Lymphocytes Relative: 13 %
Lymphs Abs: 2.2 10*3/uL (ref 0.7–4.0)
Lymphs Abs: 2.9 10*3/uL (ref 0.7–4.0)
MCH: 30.9 pg (ref 26.0–34.0)
MCH: 31.3 pg (ref 26.0–34.0)
MCHC: 30.7 g/dL (ref 30.0–36.0)
MCHC: 31.9 g/dL (ref 30.0–36.0)
MCV: 100.5 fL — ABNORMAL HIGH (ref 80.0–100.0)
MCV: 98.1 fL (ref 80.0–100.0)
Monocytes Absolute: 0.6 10*3/uL (ref 0.1–1.0)
Monocytes Absolute: 0.7 10*3/uL (ref 0.1–1.0)
Monocytes Relative: 3 %
Monocytes Relative: 3 %
Neutro Abs: 18.4 10*3/uL — ABNORMAL HIGH (ref 1.7–7.7)
Neutro Abs: 18.5 10*3/uL — ABNORMAL HIGH (ref 1.7–7.7)
Neutrophils Relative %: 82 %
Neutrophils Relative %: 85 %
Platelets: 147 10*3/uL — ABNORMAL LOW (ref 150–400)
Platelets: 154 10*3/uL (ref 150–400)
RBC: 4.21 MIL/uL — ABNORMAL LOW (ref 4.22–5.81)
RBC: 4.25 MIL/uL (ref 4.22–5.81)
RDW: 14.1 % (ref 11.5–15.5)
RDW: 14.2 % (ref 11.5–15.5)
Smear Review: ADEQUATE
WBC: 21.7 10*3/uL — ABNORMAL HIGH (ref 4.0–10.5)
WBC: 22.7 10*3/uL — ABNORMAL HIGH (ref 4.0–10.5)
nRBC: 0.7 % — ABNORMAL HIGH (ref 0.0–0.2)
nRBC: 1.3 % — ABNORMAL HIGH (ref 0.0–0.2)

## 2019-11-25 LAB — POCT I-STAT 7, (LYTES, BLD GAS, ICA,H+H)
Acid-base deficit: 7 mmol/L — ABNORMAL HIGH (ref 0.0–2.0)
Bicarbonate: 21.8 mmol/L (ref 20.0–28.0)
Calcium, Ion: 0.99 mmol/L — ABNORMAL LOW (ref 1.15–1.40)
HCT: 38 % — ABNORMAL LOW (ref 39.0–52.0)
Hemoglobin: 12.9 g/dL — ABNORMAL LOW (ref 13.0–17.0)
O2 Saturation: 99 %
Patient temperature: 97
Potassium: 4.9 mmol/L (ref 3.5–5.1)
Sodium: 143 mmol/L (ref 135–145)
TCO2: 23 mmol/L (ref 22–32)
pCO2 arterial: 54.2 mmHg — ABNORMAL HIGH (ref 32.0–48.0)
pH, Arterial: 7.207 — ABNORMAL LOW (ref 7.350–7.450)
pO2, Arterial: 152 mmHg — ABNORMAL HIGH (ref 83.0–108.0)

## 2019-11-25 LAB — GLUCOSE, CAPILLARY
Glucose-Capillary: 146 mg/dL — ABNORMAL HIGH (ref 70–99)
Glucose-Capillary: 267 mg/dL — ABNORMAL HIGH (ref 70–99)

## 2019-11-25 LAB — COMPREHENSIVE METABOLIC PANEL
ALT: 35 U/L (ref 0–44)
AST: 46 U/L — ABNORMAL HIGH (ref 15–41)
Albumin: 2.2 g/dL — ABNORMAL LOW (ref 3.5–5.0)
Alkaline Phosphatase: 75 U/L (ref 38–126)
Anion gap: 17 — ABNORMAL HIGH (ref 5–15)
BUN: 57 mg/dL — ABNORMAL HIGH (ref 8–23)
CO2: 19 mmol/L — ABNORMAL LOW (ref 22–32)
Calcium: 6.8 mg/dL — ABNORMAL LOW (ref 8.9–10.3)
Chloride: 108 mmol/L (ref 98–111)
Creatinine, Ser: 2.75 mg/dL — ABNORMAL HIGH (ref 0.61–1.24)
GFR calc Af Amer: 25 mL/min — ABNORMAL LOW (ref >60)
GFR calc non Af Amer: 21 mL/min — ABNORMAL LOW (ref >60)
Glucose, Bld: 180 mg/dL — ABNORMAL HIGH (ref 70–99)
Potassium: 5 mmol/L (ref 3.5–5.1)
Sodium: 144 mmol/L (ref 135–145)
Total Bilirubin: 1.1 mg/dL (ref 0.3–1.2)
Total Protein: 5.3 g/dL — ABNORMAL LOW (ref 6.5–8.1)

## 2019-11-25 LAB — TRIGLYCERIDES: Triglycerides: 184 mg/dL — ABNORMAL HIGH (ref <150)

## 2019-11-25 LAB — HEPARIN LEVEL (UNFRACTIONATED)
Heparin Unfractionated: 0.36 IU/mL (ref 0.30–0.70)
Heparin Unfractionated: 0.7 IU/mL (ref 0.30–0.70)

## 2019-11-25 LAB — FERRITIN: Ferritin: 422 ng/mL — ABNORMAL HIGH (ref 24–336)

## 2019-11-25 LAB — LACTIC ACID, PLASMA: Lactic Acid, Venous: 3.4 mmol/L (ref 0.5–1.9)

## 2019-11-25 LAB — C-REACTIVE PROTEIN: CRP: 6.9 mg/dL — ABNORMAL HIGH (ref <1.0)

## 2019-11-25 MED ORDER — VECURONIUM BOLUS VIA INFUSION
0.0800 mg/kg | Freq: Once | INTRAVENOUS | Status: AC
  Administered 2019-11-25: 8.9 mg via INTRAVENOUS
  Filled 2019-11-25: qty 9

## 2019-11-25 MED ORDER — VECURONIUM BROMIDE 10 MG IV SOLR
INTRAVENOUS | Status: AC
  Administered 2019-11-25: 02:00:00 10 mg
  Filled 2019-11-25: qty 10

## 2019-11-25 MED ORDER — PHENYLEPHRINE HCL-NACL 10-0.9 MG/250ML-% IV SOLN
INTRAVENOUS | Status: AC
  Filled 2019-11-25: qty 250

## 2019-11-25 MED ORDER — EPINEPHRINE PF 1 MG/ML IJ SOLN
0.5000 ug/min | INTRAVENOUS | Status: DC
  Administered 2019-11-25: 0.5 ug/min via INTRAVENOUS
  Filled 2019-11-25 (×2): qty 4

## 2019-11-25 MED ORDER — ARTIFICIAL TEARS OPHTHALMIC OINT
1.0000 "application " | TOPICAL_OINTMENT | Freq: Three times a day (TID) | OPHTHALMIC | Status: DC
  Administered 2019-11-25: 1 via OPHTHALMIC
  Filled 2019-11-25: qty 3.5

## 2019-11-25 MED ORDER — MIDAZOLAM 50MG/50ML (1MG/ML) PREMIX INFUSION
0.0000 mg/h | INTRAVENOUS | Status: DC
  Administered 2019-11-25: 03:00:00 2 mg/h via INTRAVENOUS
  Filled 2019-11-25 (×2): qty 50

## 2019-11-25 MED ORDER — VECURONIUM BROMIDE 10 MG IV SOLR
0.0000 ug/kg/min | INTRAVENOUS | Status: DC
  Administered 2019-11-25: 03:00:00 0.8 ug/kg/min via INTRAVENOUS
  Filled 2019-11-25: qty 100

## 2019-11-25 MED ORDER — HYDROCORTISONE NA SUCCINATE PF 100 MG IJ SOLR
100.0000 mg | Freq: Four times a day (QID) | INTRAMUSCULAR | Status: DC
  Administered 2019-11-25 (×2): 100 mg via INTRAVENOUS
  Filled 2019-11-25 (×2): qty 2

## 2019-11-25 MED ORDER — EPINEPHRINE PF 1 MG/ML IJ SOLN
0.5000 ug/min | INTRAVENOUS | Status: DC
  Administered 2019-11-25 (×2): 30 ug/min via INTRAVENOUS
  Filled 2019-11-25 (×3): qty 4

## 2019-11-25 MED ORDER — SODIUM CHLORIDE 0.9 % IV SOLN
1.0000 g | Freq: Two times a day (BID) | INTRAVENOUS | Status: DC
  Administered 2019-11-25 (×2): 1 g via INTRAVENOUS
  Filled 2019-11-25 (×3): qty 1

## 2019-11-25 MED ORDER — PHENYLEPHRINE HCL-NACL 10-0.9 MG/250ML-% IV SOLN
0.0000 ug/min | INTRAVENOUS | Status: DC
  Administered 2019-11-25: 50 ug/min via INTRAVENOUS
  Administered 2019-11-25: 40 ug/min via INTRAVENOUS
  Administered 2019-11-25 (×2): 50 ug/min via INTRAVENOUS
  Filled 2019-11-25 (×2): qty 250

## 2019-11-25 MED ORDER — MIDAZOLAM BOLUS VIA INFUSION
1.0000 mg | INTRAVENOUS | Status: DC | PRN
  Filled 2019-11-25: qty 2

## 2019-11-25 DEATH — deceased

## 2019-11-26 LAB — CULTURE, BLOOD (ROUTINE X 2): Culture: NO GROWTH

## 2019-11-28 LAB — CULTURE, RESPIRATORY W GRAM STAIN: Culture: NORMAL

## 2019-11-29 DIAGNOSIS — I2609 Other pulmonary embolism with acute cor pulmonale: Secondary | ICD-10-CM

## 2019-11-29 DIAGNOSIS — I50810 Right heart failure, unspecified: Secondary | ICD-10-CM

## 2019-11-29 DIAGNOSIS — N179 Acute kidney failure, unspecified: Secondary | ICD-10-CM

## 2019-11-29 DIAGNOSIS — E872 Acidosis, unspecified: Secondary | ICD-10-CM

## 2019-11-29 DIAGNOSIS — R579 Shock, unspecified: Secondary | ICD-10-CM

## 2019-11-29 DIAGNOSIS — I2699 Other pulmonary embolism without acute cor pulmonale: Secondary | ICD-10-CM

## 2019-11-29 LAB — CULTURE, BLOOD (ROUTINE X 2)
Culture: NO GROWTH
Culture: NO GROWTH
Special Requests: ADEQUATE

## 2019-12-23 NOTE — Progress Notes (Signed)
ANTICOAGULATION CONSULT NOTE  Pharmacy Consult:  Heparin Indication:  Rule out PE   Patient Measurements: Height: 5\' 8"  (172.7 cm) Weight: 244 lb 0.8 oz (110.7 kg) IBW/kg (Calculated) : 68.4 Heparin Dosing Weight: 93 kg  Vital Signs: Temp: 99.7 F (37.6 C) (02/01 0752) Temp Source: Axillary (02/01 0752) BP: 111/59 (02/01 0835) Pulse Rate: 121 (02/01 0800)  Labs: Recent Labs    11/24/19 0550 11/24/19 0810 11/24/19 1326 11/24/19 1607 11/24/19 1942 11/24/19 2105 11/24/19 2130 12/08/2019 0018 12/08/2019 0018 12/09/2019 0029 12/21/2019 0210 12/02/2019 0216 12/13/2019 0800  HGB  --    < > 16.0   < > 15.2   < >  --  13.3   < >  --  12.9* 13.0  --   HCT  --    < > 49.5   < > 46.8   < >  --  41.7  --   --  38.0* 42.3  --   PLT  --    < > 196   < > 167  --   --  147*  --   --   --  154  --   APTT  --   --  49*  --   --   --   --   --   --   --   --   --   --   LABPROT  --   --  29.7*  --   --   --   --   --   --   --   --   --   --   INR  --   --  2.8*  --   --   --   --   --   --   --   --   --   --   HEPARINUNFRC  --   --   --   --   --   --  0.41  --   --  0.36  --   --  0.70  CREATININE 2.36*  --   --   --   --   --  2.40*  --   --   --   --  2.75*  --    < > = values in this interval not displayed.      Assessment: 79 year old male presented with SOB and Covid-19.  Patient was transferred to the ICU on 11/23/19 PM due to acute hypoxia requiring intubation.  He continues to have increased WOB and d-dimer increased to > 20.  Patient was presumed to have a PE given Right heart strain; therefore alteplase was given and pharmacy was consulted to initiate IV heparin post-tPA. Heparin level is at the high end of reference range at 0.7 this morning, RN reports bleeding from IV line.    Goal of Therapy:  Heparin level 0.3-0.7 units/mL Monitor platelets by anticoagulation protocol: Yes   Plan:  Reduce heparin to 1050 units/hr Daily HL, CBC   12/01/2019 12/08/2019 9:43 AM

## 2019-12-23 NOTE — Procedures (Signed)
Extubation Procedure Note  Patient Details:   Name: Matthew Mendez DOB: 26-Jan-1941 MRN: [id number]   Airway Documentation:    Vent end date: 12/07/2019 Vent end time: 1210   Evaluation  O2 sats: transiently fell during during procedure Complications: Complications of apnea Patient did not tolerate procedure well. Bilateral Breath Sounds: Clear   No   Terminal extubation done at this time. No spontaneous respirations. RN at bedside  Lurlean Leyden 12/07/2019, 12:20 PM

## 2019-12-23 NOTE — Progress Notes (Signed)
eLink Physician-Brief Progress Note Patient Name: Matthew Mendez DOB: Mar 23, 1941 MRN: [id number]   Date of Service  12/15/2019  HPI/Events of Note  Notified of hypotension with SBP in 50s  eICU Interventions  Adding phenylephrine Labs pending, have been sent Have also asked bedside ICU team to evaluate the patient - critically ill with worsening hemodynamics. Barely any urine output      Intervention Category Major Interventions: Hypotension - evaluation and management  Milinda Sweeney G Jakell Trusty 11/27/2019, 12:56 AM

## 2019-12-23 NOTE — Progress Notes (Signed)
Notified Jayme Cloud MD regarding pt's blood pressure decreasing. MAP currently in mid 50s. Verbal orders given to increase Epi gtt to 30 mic. Will continue to monitor.

## 2019-12-23 NOTE — Progress Notes (Signed)
ANTICOAGULATION CONSULT NOTE  Pharmacy Consult:  Heparin Indication:  Rule out PE  No Known Allergies  Patient Measurements: Height: 5\' 8"  (172.7 cm) Weight: 244 lb 0.8 oz (110.7 kg) IBW/kg (Calculated) : 68.4 Heparin Dosing Weight: 93 kg  Vital Signs: Temp: 97.8 F (36.6 C) (01/31 2320) Temp Source: Axillary (01/31 2320) BP: 107/70 (02/01 0100) Pulse Rate: 75 (02/01 0115)  Labs: Recent Labs    11/23/19 0500 11/23/19 2345 11/24/19 0550 11/24/19 0810 11/24/19 1326 11/24/19 1607 11/24/19 1942 11/24/19 1942 11/24/19 2105 11/24/19 2130 18-Dec-2019 0018 2019/12/18 0029  HGB 15.1   < >  --    < > 16.0   < > 15.2   < > 14.6  --  13.3  --   HCT 44.8   < >  --    < > 49.5   < > 46.8  --  43.0  --  41.7  --   PLT 258   < >  --    < > 196  --  167  --   --   --  147*  --   APTT  --   --   --   --  49*  --   --   --   --   --   --   --   LABPROT  --   --   --   --  29.7*  --   --   --   --   --   --   --   INR  --   --   --   --  2.8*  --   --   --   --   --   --   --   HEPARINUNFRC  --   --   --   --   --   --   --   --   --  0.41  --  0.36  CREATININE 0.78  --  2.36*  --   --   --   --   --   --  2.40*  --   --    < > = values in this interval not displayed.    Estimated Creatinine Clearance: 31.1 mL/min (A) (by C-G formula based on SCr of 2.4 mg/dL (H)).   Assessment: 64 YOM presented with SOB and Covid-19.  Patient was transferred to the ICU on 11/23/19 PM due to acute hypoxia requiring intubation.  He continues to have increased WOB and d-dimer increased to > 20.  Alteplase was given for rule out PE and Pharmacy consulted to initiate IV heparin since aPTT post tPA is less than 80 seconds.  INR is elevated at 2.8; however, with possible acute PE, will proceed with starting IV heparin.  2/1 AM update:  Heparin level therapeutic x 2 Plts down some (monitor closely)  Goal of Therapy:  Heparin level 0.3 - 0.5 units/ml for 24 hrs post tPA Monitor platelets by anticoagulation  protocol: Yes   Plan:  Cont heparin at 1150 units/hr Daily heparin level and CBC  Watch plts closely  F/U Doppler results-preliminary reports are negative for DVT  11/29/2019, PharmD, BCPS Clinical Pharmacist Phone: [phone]

## 2019-12-23 NOTE — Progress Notes (Signed)
Camera assessment with EMD, Dr Roxan Hockey. Acutely hypotensive. Awaiting CBC and lactic acid. Added neo

## 2019-12-23 NOTE — Progress Notes (Signed)
NAME:  Matthew Mendez, MRN:  [id number], DOB:  [birth year], LOS: 4 ADMISSION DATE:  Nov 25, 2019, CONSULTATION DATE:  11/29/2019 REFERRING MD:  Jerolyn Center, CHIEF COMPLAINT:  Hypoxic respiratory failure   Brief History   79 y.o. M with PMH of osteoarthritis who was diagnosed with Covid-19 and admitted with pneumonia on 1/28.   In the evenong of 1/29 patient had an episode of confusion, hypoxia and new onset atrial fibrillation and required increasing oxygen supplementation.  Converted to NSR. Then, on the evening of 1/30, patient became acute hypoxic despite HFNC and NRB support, was transferred to the ICU and intubated.    History of present illness   Mr. Matthew Mendez is a 79 y.o. M with PMH of arthritis who lives at home who was diagnosed with Covid-19 approximately 1/22 and presented to the ED on 1/28 for shortness of breath and found to have Covid-19 pneumonia.  He was admitted and started on Remdesevir, Dexamethasone and received Actemra.  CT scan chest on admission showed bilateral extensive ground glass opacities without PE.  Had increased WOB and hypoxia today, unable to get sats >81%. Increased opacities on chest x-ray today, particularly in RUL fields.  Intubated after transfer to the MICU.  Of note patient did receive convalescent plasma this afternoon around 1500.     Past Medical History   has a past medical history of Arthritis and Urinary frequency.   Significant Hospital Events   1/28  Admit to Hospitalists 1/29 PCCM consult, Afib event, converted to NSR 1/30, transferred to MICU for significant hypoxia, intubated.     Consults:  PCCM  Procedures:  Intubated 1/30  Significant Diagnostic Tests:  1/28 CT chest>>Extensive ground-glass opacities throughout both lungs,compatible with multifocal pneumonia in the setting of known COVID19 infection. 1/29 CXR>>Improving bilateral pulmonary infiltrates, most focal in the right upper lobe. 1/30 CXR>> worsening infiltrates  Micro Data:    1/28 Sars-CoV-2>>positive 1/28 BCx2>>  Antimicrobials:  Remdesevir 1/28- Cefepime 1/30  Interim history/subjective:  Continued further decompensation overnight.  Now maxed on 3 vasopressors.  Was paralyzed overnight as well.  Increase sedation.  Does have better vent compliance.  However vital signs currently not compatible life with mean arterial pressure in the 40s.  Gnosis discussed with family.  Arranging family visitation.  Objective   Blood pressure (!) 111/59, pulse (!) 121, temperature 99.7 F (37.6 C), temperature source Axillary, resp. rate (!) 35, height 5\' 8"  (1.727 m), weight 110.7 kg, SpO2 97 %. CVP:  [7 mmHg-8 mmHg] 7 mmHg  Vent Mode: PRVC FiO2 (%):  [40 %-100 %] 60 % Set Rate:  [35 bmp] 35 bmp Vt Set:  [450 mL] 450 mL PEEP:  [15 cmH20] 15 cmH20 Plateau Pressure:  [24 cmH20-30 cmH20] 24 cmH20   Intake/Output Summary (Last 24 hours) at 11/30/2019 01/23/2020 Last data filed at 12/13/2019 0800 Gross per 24 hour  Intake 5389.66 ml  Output 175 ml  Net 5214.66 ml   Filed Weights   11/27/2019 2040 12/11/2019 0400  Weight: 110.7 kg 110.7 kg   General appearance: 79 y.o., male, ileal intubated on mechanical life support Eyes: Anicteric, pupils reactive HENT: NCAT, mucous membranes dry Neck: Tracheal tube in place, trachea midline Lungs: Bilateral ventilated breath sounds CV: Regular rate rhythm, S1-S2 Abdomen: Soft, non-tender; non-distended, BS present  Extremities: Bleeding from right groin Skin: Normal temperature, turgor and texture; no rash Neuro: Sedated on mechanical ventilation, paralyzed    Resolved Hospital Problem list     Assessment & Plan:  Acute Hypoxic Respiratory failure secondary to bilateral Covid-19 Pneumonia, development of acute respiratory distress syndrome. Intubated 1/30, with improvement in sats.  Increasing infiltrates particularly on R side.  S/p tocilizumab 1/28, conv plasma 1/30 Severe acute respiratory acidosis Presumed possible massive  PE status post 100 mg TPA Progressive cardiogenic shock Multiorgan failure Right ventricular failure P: Discussed poor prognosis with patient's daughter via phone. Due to escalating pressor requirements patient is currently maxed on norepinephrine and vasopressin and epinephrine.  Paralysis was stopped.  Due to progressive hypotension Systolic blood pressures in the 70s, MAP of 40. We discussed prognosis with patient's family. Patient is going to pass shortly. CODE STATUS changed to DNR. Family is going to arrange visitation and will plan for transition to comfort care following family visitation.  New onset Atrial Fibrillation, now rate controlled P: -Repeat echo pending, holding anticoagulation  Acute renal failure, in setting of sepsis hypotension. -No acute indication for dialysis at this time. -Continue to observe. -Repeat BMP in a.m.  Elevated D-dimer -Resumed massive PE   Best practice:  Diet: regular  Pain/Anxiety/Delirium protocol (if indicated): n/a VAP protocol (if indicated): n/a DVT prophylaxis: Lovenox GI prophylaxis: n/a Glucose control: n/a,  Mobility: bed rest Code Status: full code Family Communication: Called and spoke with patient's daughter.  Arranging family visitation. Disposition: 25M ICU  Labs   CBC: Recent Labs  Lab 12-01-19 1608 2019/12/01 1608 11/22/19 0853 11/22/19 0853 11/23/19 0500 11/23/19 2345 11/24/19 0446 11/24/19 0810 11/24/19 1326 11/24/19 1607 11/24/19 1942 11/24/19 2105 12/10/2019 0018 11/30/2019 0210 12/17/2019 0216  WBC 8.9   < > 7.9   < > 14.0*  --  28.7*  --  28.1*  --  22.9*  --  21.7*  --  22.7*  NEUTROABS 7.5  --  6.7  --  12.1*  --   --   --   --   --   --   --  18.5*  --  18.4*  HGB 14.2   < > 14.8   < > 15.1   < > 14.5   < > 16.0   < > 15.2 14.6 13.3 12.9* 13.0  HCT 41.7   < > 43.7   < > 44.8   < > 46.6   < > 49.5   < > 46.8 43.0 41.7 38.0* 42.3  MCV 91.9   < > 93.2   < > 92.4  --  100.6*  --  98.8  --  97.7  --   98.1  --  100.5*  PLT 184   < > 171   < > 258  --  260  --  196  --  167  --  147*  --  154   < > = values in this interval not displayed.    Basic Metabolic Panel: Recent Labs  Lab 11/22/19 0853 11/22/19 0853 11/22/19 2212 11/23/19 0500 11/23/19 2345 11/24/19 0550 11/24/19 0810 11/24/19 1607 11/24/19 2105 11/24/19 2130 12/09/2019 0210 12/22/2019 0216  NA 136   < >  --  137   < > 144   < > 144 142 144 143 144  K 4.2   < >  --  3.9   < > 3.7   < > 3.7 4.5 4.7 4.9 5.0  CL 101  --   --  99  --  102  --   --   --  106  --  108  CO2 23  --   --  25  --  20*  --   --   --  21*  --  19*  GLUCOSE 153*  --   --  170*  --  337*  --   --   --  195*  --  180*  BUN 10  --   --  19  --  31*  --   --   --  50*  --  57*  CREATININE 0.77  --   --  0.78  --  2.36*  --   --   --  2.40*  --  2.75*  CALCIUM 8.8*  --   --  8.8*  --  8.8*  --   --   --  7.4*  --  6.8*  MG  --   --  2.4  --   --  2.8*  --   --   --   --   --   --    < > = values in this interval not displayed.   GFR: Estimated Creatinine Clearance: 27.1 mL/min (A) (by C-G formula based on SCr of 2.75 mg/dL (H)). Recent Labs  Lab 11/23/2019 1608 11/16/2019 1900 11/22/19 0853 11/24/19 1326 11/24/19 1942 12/09/2019 0018 12/13/2019 0216  PROCALCITON 0.22  --   --   --   --   --   --   WBC 8.9  --    < > 28.1* 22.9* 21.7* 22.7*  LATICACIDVEN 2.5* 1.7  --   --  4.1* 3.4*  --    < > = values in this interval not displayed.    Liver Function Tests: Recent Labs  Lab 11/02/2019 1608 11/22/19 0853 11/23/19 0500 11/24/19 0550 12/15/2019 0216  AST 58* 42* 40 59* 46*  ALT 30 30 29  36 35  ALKPHOS 47 49 55 99 75  BILITOT 1.3* 0.5 0.6 1.3* 1.1  PROT 6.5 6.7 6.7 7.3 5.3*  ALBUMIN 2.8* 2.6* 2.5* 2.8* 2.2*   No results for input(s): LIPASE, AMYLASE in the last 168 hours. No results for input(s): AMMONIA in the last 168 hours.  ABG    Component Value Date/Time   PHART 7.207 (L) 12/17/2019 0210   PCO2ART 54.2 (H) 12/06/2019 0210   PO2ART  152.0 (H) 12/20/2019 0210   HCO3 21.8 12/14/2019 0210   TCO2 23 11/30/2019 0210   ACIDBASEDEF 7.0 (H) 12/19/2019 0210   O2SAT 99.0 12/06/2019 0210     Coagulation Profile: Recent Labs  Lab 11/24/19 1326  INR 2.8*    Cardiac Enzymes: No results for input(s): CKTOTAL, CKMB, CKMBINDEX, TROPONINI in the last 168 hours.  HbA1C: Hgb A1c MFr Bld  Date/Time Value Ref Range Status  11/24/2019 02:25 PM 6.1 (H) 4.8 - 5.6 % Final    Comment:    (NOTE) Pre diabetes:          5.7%-6.4% Diabetes:              >6.4% Glycemic control for   <7.0% adults with diabetes     CBG: Recent Labs  Lab 11/24/19 1551 11/24/19 1956 11/24/19 2331 12/21/2019 0341 12/21/2019 0748  GLUCAP 233* 187* 167* 146* 267*    This patient is critically ill with multiple organ system failure; which, requires frequent high complexity decision making, assessment, support, evaluation, and titration of therapies. This was completed through the application of advanced monitoring technologies and extensive interpretation of multiple databases. During this encounter critical care time was devoted to patient care services described in this note for 32 minutes.  Garner Nash, DO [facility] 2019/12/20 9:26 AM

## 2019-12-23 NOTE — Progress Notes (Signed)
Pharmacy Antibiotic Note  Matthew Mendez is a 79 y.o. male admitted on 12-20-19 with respiratory distress, sepsis, bacteremia.  Pharmacy has been consulted to change Cefepime to Merrem. WBC remains elevated. Worsening renal function. Worsening clinical status with increasing pressor requirements.   Plan: Already on vancomycin  DC Cefepime Add Merrem 1g IV q12h  Height: 5\' 8"  (172.7 cm) Weight: 244 lb 0.8 oz (110.7 kg) IBW/kg (Calculated) : 68.4  Temp (24hrs), Avg:97.2 F (36.2 C), Min:96.3 F (35.7 C), Max:97.8 F (36.6 C)  Recent Labs  Lab Dec 20, 2019 1608 12-20-19 1608 2019/12/20 1900 11/22/19 0853 11/22/19 0853 11/23/19 0500 11/23/19 0500 11/24/19 0446 11/24/19 0550 11/24/19 1326 11/24/19 1942 11/24/19 2130 12/19/2019 0018 12/04/2019 0216  WBC 8.9   < >  --  7.9   < > 14.0*   < > 28.7*  --  28.1* 22.9*  --  21.7* 22.7*  CREATININE 0.99  --   --  0.77  --  0.78  --   --  2.36*  --   --  2.40*  --   --   LATICACIDVEN 2.5*  --  1.7  --   --   --   --   --   --   --  4.1*  --  3.4*  --    < > = values in this interval not displayed.    Estimated Creatinine Clearance: 31.1 mL/min (A) (by C-G formula based on SCr of 2.4 mg/dL (H)).    No Known Allergies  01/23/20, PharmD, BCPS Clinical Pharmacist Phone: [phone]

## 2019-12-23 NOTE — Progress Notes (Signed)
Called by elink to evaluate patient for hypotension.   Worsened over course of evening.  Bedside eval: echo with enlarged RV, no effusion no tamponade.  No ptx on [place] or by auscultation.  Some oozing/bleeding from groin line, persistent.  Very dyssynchronous with the vent on PRVC.  Trial of paralysis. MV improved to 14s, Plateau 28.   Repeat ABG in 1/2 hr.  Difficulty with resp acidosis currently.   Cap phenylephrine at 50 given RV failure/PHTN 2/2 presumed massive PE.  Increase levophed to max, can add epi if needed (stable BP on levophed and phenylephrine at time of exam).  Possibly sepsis contributing, will broaden to [facility] and add stress dose steroids.   Discussed poor prognosis with daughter.   She understands.  Does not want him to suffer.   Wants [place] to be aggressive with his care (was previously healthy and very functional, independent), but not not want him to suffer.  Plan for cpr in the setting of cardiac arrest, but does not want prolonged code, especially if not likely to be helpful.

## 2019-12-23 NOTE — Progress Notes (Signed)
PCCM interval progress note:  Pt increasingly unstable overnight, on Levophed and Vasopressin and had suddenly worsening hypotension and started on Neosynephrine.  Pt. Has been oozing a significant amount from around femoral CVC after TPA and heparin.  Pt seen with Dr. Marcos Eke and bedsude US showed no pneumothorax or tamponade/pericardial effusion.   Lactic acid slightly improved to 3.4.   He has been maintaining adequate oxygen sats.  -start Epi gtt, follow up h/h, broaden to Vanc/Meropenem -Dr. Jayme Cloud updated patient's daughter of worsening clinical status  Darcella Gasman Reona Zendejas, PA-C

## 2019-12-23 NOTE — Death Summary Note (Signed)
DEATH SUMMARY   Patient Details  Name: Matthew Mendez MRN: [id number] DOB: [birth year]  Admission/Discharge Information   Admit Date:  2019/12/08  Date of Death: Date of Death: 2019-12-12  Time of Death: Time of Death: 1215/02/25  Length of Stay: 4  Referring Physician: Deloris Ping, MD   Reason(s) for Hospitalization  79 y.o. M with PMH of osteoarthritis who was diagnosed with Covid-19 and admitted with pneumonia on Dec 07, 2022.   In the evenong of 1/29 patient had an episode of confusion, hypoxia and new onset atrial fibrillation and required increasing oxygen supplementation.  Converted to NSR. Then, on the evening of 1/30, patient became acute hypoxic despite HFNC and NRB support, was transferred to the ICU and intubated.   Diagnoses  Preliminary cause of death: Acute hypoxemic respiratory failure due to COVID-19 [facility]) Secondary Diagnoses (including complications and co-morbidities):  Principal Problem:   Pneumonia due to COVID-19 virus Active Problems:   Transaminitis   Total bilirubin, elevated   Respiratory failure, acute (HCC)   Right ventricular failure (HCC)   Acute massive pulmonary embolism (HCC)   Acute cor pulmonale (HCC)   Shock (HCC)   Lactic acid acidosis   Acute renal failure (ARF) [facility])   Brief Hospital Course (including significant findings, care, treatment, and services provided and events leading to death)  Matthew Mendez is a 79 y.o. year old male who PMH of arthritis who lives at home who was diagnosed with Covid-19 approximately 1/22 and presented to the ED on 2022-12-07 for shortness of breath and found to have Covid-19 pneumonia.  He was admitted and started on Remdesevir, Dexamethasone and received Actemra.  CT scan chest on admission showed bilateral extensive ground glass opacities without PE.  Had increased WOB and hypoxia today, unable to get sats >81%. Increased opacities on chest x-ray today, particularly in RUL fields.  Intubated after transfer to the  MICU.  Of note patient did receive convalescent plasma this afternoon around 1500.  Upon presentation to the intensive care unit patient had ongoing further decline.  Bedside echocardiogram revealed a enlarged akinetic right ventricle concerning for massive pulmonary embolism.  Patient had acute right ventricular failure and acute cor pulmonale.  The decision was made for 100 mg of TPA administration.  Patient improved for a brief period following TPA administration.  The following day he continued to have increased pressor requirements and unable to oxygenate.  Discussion with the family regarding goals of care at bedside.  The decision was made for transition to comfort care measures.  Withdrawal of life-sustaining order set was placed.  Patient passed peacefully in the intensive care unit.  Family were able to visit prior to this.  Family was very grateful for the attempts at saving his life.  Although unsuccessful.  Pertinent Labs and Studies  Significant Diagnostic Studies CT Angio Chest PE W and/or Wo Contrast  Result Date: 12-08-19 CLINICAL DATA:  Shortness of breath, COVID-19 positive EXAM: CT ANGIOGRAPHY CHEST WITH CONTRAST TECHNIQUE: Multidetector CT imaging of the chest was performed using the standard protocol during bolus administration of intravenous contrast. Multiplanar CT image reconstructions and MIPs were obtained to evaluate the vascular anatomy. CONTRAST:  OMNIPAQUE IOHEXOL 350 MG/ML SOLN COMPARISON:  Same day chest radiograph FINDINGS: Cardiovascular: Satisfactory opacification of the pulmonary arteries to the segmental level. No evidence of pulmonary embolism. Normal heart size. No pericardial effusion. Thoracic aorta is nonaneurysmal. Atherosclerotic calcifications of the aorta and coronary arteries. Mediastinum/Nodes: Mediastinal lymphadenopathy. Reference nodes include AP window node  measuring 19 mm short axis (series 4, image 39) and precarinal node measuring 17 mm  (series 4, image 42). Mildly prominent bilateral hilar lymph nodes. No axillary lymphadenopathy. Unremarkable thyroid gland, trachea, and esophagus. Lungs/Pleura: Extensive ground-glass opacities throughout both lungs, largely in a peripheral distribution. No pleural effusion or pneumothorax. Upper Abdomen: No acute findings. Musculoskeletal: No chest wall abnormality. No acute or significant osseous findings. Review of the MIP images confirms the above findings. IMPRESSION: 1. No evidence of acute pulmonary embolism. 2. Extensive ground-glass opacities throughout both lungs, compatible with multifocal pneumonia in the setting of known COVID 19 infection. 3. Mediastinal lymphadenopathy, likely reactive. 4. Aortic and coronary artery atherosclerosis. Electronically Signed   By: Duanne GuessNicholas  Plundo D.O.   On: 10/28/2019 20:00   DG CHEST PORT 1 VIEW  Result Date: 11/24/2019 CLINICAL DATA:  Initial evaluation for respiratory acidosis, COVID pneumonia. EXAM: PORTABLE CHEST 1 VIEW COMPARISON:  Prior radiograph from 11/23/2019. FINDINGS: Endotracheal tube remains in place, tip position 3.3 cm above the carina. Cardiac and mediastinal silhouettes are stable, and remain within normal limits. Aortic atherosclerosis noted. Lungs are hypoinflated. Probable small right pleural effusion. Hazy parenchymal and interstitial opacities throughout the right greater than left lungs, similar within the right lung, slightly improved on the left. Portion of these findings may be related to patient rotation. No pneumothorax. Visualized soft tissues and osseous structures are unchanged. Mild gaseous distension of the stomach noted within the left upper quadrant. IMPRESSION: 1. Tip of endotracheal to 3.3 cm above the carina. 2. Similar degree of shallow lung inflation with associated hazy and interstitial bilateral lung opacities, right greater than left. Overall, changes are similar within the right lung, while overall slightly improved  within the left lung as compared to previous. 3. Suspected trace right pleural effusion. Electronically Signed   By: Rise MuBenjamin  McClintock M.D.   On: 11/24/2019 03:52   Portable Chest x-ray  Result Date: 11/23/2019 CLINICAL DATA:  Intubation. EXAM: PORTABLE CHEST 1 VIEW COMPARISON:  Radiograph earlier this day. CT 11/19/2019 FINDINGS: Endotracheal tube tip 4.1 cm from the carina. Improved lung volumes with heterogeneous bilateral opacities, not significantly changed allowing for differences in aeration. Unchanged heart size and mediastinal contours. Aortic atherosclerosis. No pneumothorax or pleural effusion. Air-filled stomach in the upper abdomen is partially included. IMPRESSION: 1. Endotracheal tube tip 4.1 cm from the carina. 2. Improved lung volumes from earlier this day with heterogeneous bilateral opacities, not significantly changed allowing for differences in aeration. Electronically Signed   By: Narda RutherfordMelanie  Sanford M.D.   On: 11/23/2019 23:21   DG CHEST PORT 1 VIEW  Result Date: 11/23/2019 CLINICAL DATA:  COVID pneumonia EXAM: PORTABLE CHEST 1 VIEW COMPARISON:  11/22/2019 FINDINGS: There are diffuse bilateral hazy airspace opacities which have significantly worsened since the prior study. Heart size is essentially unchanged from prior study. There may be small bilateral pleural effusions. There is no pneumothorax. No acute osseous abnormality. IMPRESSION: Worsening multifocal airspace opacities with compared to prior study. Findings are consistent with the patient's history of viral pneumonia. Underlying pulmonary edema is difficult to exclude in this patient. Electronically Signed   By: Katherine Mantlehristopher  Green M.D.   On: 11/23/2019 20:28   DG CHEST PORT 1 VIEW  Result Date: 11/22/2019 CLINICAL DATA:  Hypoxia. EXAM: PORTABLE CHEST 1 VIEW COMPARISON:  November 21, 2019 FINDINGS: Pulmonary infiltrates remain, particular in the right upper lobe. However, the infiltrates have significantly improved in the  interval. No other interval changes. IMPRESSION: Improving bilateral pulmonary infiltrates, most focal  in the right upper lobe. Electronically Signed   By: Dorise Bullion III M.D   On: 11/22/2019 20:22   DG Chest Port 1 View  Result Date: 15-Dec-2019 CLINICAL DATA:  Pt BIB [facility] from home, c/o shortness of breath and chest pain when coughing. Diagnosed with COVID on 1/22. On EMS arrival, pt 75% on room air, improved to 90% on simple mask @ 9L. On arrival to ED, pt 74% on room air, improved to 90% on 15L. Pt A&O x 4, denies pain at this time. EXAM: PORTABLE CHEST 1 VIEW COMPARISON:  04/22/2015 FINDINGS: There are bilateral hazy airspace opacities, most confluent in the right perihilar and medial upper lung zone, all new since the prior chest radiograph. No pleural effusion or pneumothorax. Cardiac silhouette is normal in size. No convincing mediastinal or hilar masses. Skeletal structures are grossly intact. IMPRESSION: 1. Bilateral airspace lung opacities consistent with multifocal COVID-19 pneumonia. Electronically Signed   By: Lajean Manes M.D.   On: 12-15-19 16:23   VAS [place] LOWER EXTREMITY VENOUS (DVT)  Result Date: 12/13/2019  Lower Venous Study Indications: Covid-19, elevated D-dimer.  Limitations: Patient on vent but not sedated, moving legs. Comparison Study: No prior bilateral lower extremity venous duplex on file Performing Technologist: Sharion Dove RVS  Examination Guidelines: A complete evaluation includes B-mode imaging, spectral Doppler, color Doppler, and power Doppler as needed of all accessible portions of each vessel. Bilateral testing is considered an integral part of a complete examination. Limited examinations for reoccurring indications may be performed as noted.  +---------+---------------+---------+-----------+----------+--------------+ RIGHT    CompressibilityPhasicitySpontaneityPropertiesThrombus Aging  +---------+---------------+---------+-----------+----------+--------------+ CFV      Full           Yes      Yes                                 +---------+---------------+---------+-----------+----------+--------------+ SFJ      Full                                                        +---------+---------------+---------+-----------+----------+--------------+ FV Prox  Full                                                        +---------+---------------+---------+-----------+----------+--------------+ FV Mid   Full                                                        +---------+---------------+---------+-----------+----------+--------------+ FV DistalFull                                                        +---------+---------------+---------+-----------+----------+--------------+ PFV      Full                                                        +---------+---------------+---------+-----------+----------+--------------+  POP      Full           Yes      Yes                                 +---------+---------------+---------+-----------+----------+--------------+ PTV      Full                                                        +---------+---------------+---------+-----------+----------+--------------+ PERO     Full                                                        +---------+---------------+---------+-----------+----------+--------------+   +---------+---------------+---------+-----------+----------+--------------+ LEFT     CompressibilityPhasicitySpontaneityPropertiesThrombus Aging +---------+---------------+---------+-----------+----------+--------------+ CFV      Full           Yes      Yes                                 +---------+---------------+---------+-----------+----------+--------------+ SFJ      Full                                                         +---------+---------------+---------+-----------+----------+--------------+ FV Prox  Full                                                        +---------+---------------+---------+-----------+----------+--------------+ FV Mid   Full                                                        +---------+---------------+---------+-----------+----------+--------------+ FV DistalFull                                                        +---------+---------------+---------+-----------+----------+--------------+ PFV      Full                                                        +---------+---------------+---------+-----------+----------+--------------+ POP      Full           Yes      Yes                                 +---------+---------------+---------+-----------+----------+--------------+  PTV      Full                                                        +---------+---------------+---------+-----------+----------+--------------+ PERO     Full                                                        +---------+---------------+---------+-----------+----------+--------------+     Summary: Right: There is no evidence of deep vein thrombosis in the lower extremity. However, portions of this examination were limited- see technologist comments above. Left: There is no evidence of deep vein thrombosis in the lower extremity. However, portions of this examination were limited- see technologist comments above.  *See table(s) above for measurements and observations. Electronically signed by Fabienne Bruns MD on 12/04/2019 at 5:09:36 PM.    Final     Microbiology Recent Results (from the past 240 hour(s))  Blood Culture (routine x 2)     Status: Abnormal   Collection Time: 11-24-2019  4:14 PM   Specimen: BLOOD LEFT HAND  Result Value Ref Range Status   Specimen Description BLOOD LEFT HAND  Final   Special Requests   Final    BOTTLES DRAWN AEROBIC AND ANAEROBIC Blood  Culture results may not be optimal due to an inadequate volume of blood received in culture bottles   Culture  Setup Time   Final    GRAM POSITIVE RODS ANAEROBIC BOTTLE ONLY CRITICAL RESULT CALLED TO, READ BACK BY AND VERIFIED WITH: PHARMD TEE GANG @1424  11/23/19 AKT    Culture (A)  Final    ACTINOMYCES NEUII Standardized susceptibility testing for this organism is not available. Performed at [facility], [address], [place], [place]    Report Status 11/24/2019 FINAL  Final  Blood Culture (routine x 2)     Status: None   Collection Time: 2019/11/24  4:23 PM   Specimen: BLOOD RIGHT HAND  Result Value Ref Range Status   Specimen Description BLOOD RIGHT HAND  Final   Special Requests   Final    BOTTLES DRAWN AEROBIC AND ANAEROBIC Blood Culture results may not be optimal due to an inadequate volume of blood received in culture bottles   Culture NO GROWTH 5 DAYS  Final   Report Status 11/26/2019 FINAL  Final  Respiratory Panel by RT PCR (Flu A&B, Covid) - Nasopharyngeal Swab     Status: Abnormal   Collection Time: 2019/11/24  5:29 PM   Specimen: Nasopharyngeal Swab  Result Value Ref Range Status   SARS Coronavirus 2 by RT PCR POSITIVE (A) NEGATIVE Final    Comment: RESULT CALLED TO, READ BACK BY AND VERIFIED WITH: C STURGHAN RN 11/24/2019 1826 JDW (NOTE) SARS-CoV-2 target nucleic acids are DETECTED. SARS-CoV-2 RNA is generally detectable in upper respiratory specimens  during the acute phase of infection. Positive results are indicative of the presence of the identified virus, but do not rule out bacterial infection or co-infection with other pathogens not detected by the test. Clinical correlation with patient history and other diagnostic information is necessary to determine patient infection status. The expected result is Negative. Fact Sheet for Patients:  https://www.moore.com/ Fact Sheet for Healthcare  Providers: https://www.young.biz/ This test is not yet approved or cleared by the [place] FDA and  has been authorized for detection and/or diagnosis of SARS-CoV-2 by FDA under an Emergency Use Authorization (EUA).  This EUA will remain in effect (meaning this test can be used) for th e duration of  the COVID-19 declaration under Section 564(b)(1) of the Act, 21 U.S.C. section 360bbb-3(b)(1), unless the authorization is terminated or revoked sooner.    Influenza A by PCR NEGATIVE NEGATIVE Final   Influenza B by PCR NEGATIVE NEGATIVE Final    Comment: (NOTE) The Xpert Xpress SARS-CoV-2/FLU/RSV assay is intended as an aid in  the diagnosis of influenza from Nasopharyngeal swab specimens and  should not be used as a sole basis for treatment. Nasal washings and  aspirates are unacceptable for Xpert Xpress SARS-CoV-2/FLU/RSV  testing. Fact Sheet for Patients: https://www.moore.com/ Fact Sheet for Healthcare Providers: https://www.young.biz/ This test is not yet approved or cleared by the [place] FDA and  has been authorized for detection and/or diagnosis of SARS-CoV-2 by  FDA under an Emergency Use Authorization (EUA). This EUA will remain  in effect (meaning this test can be used) for the duration of the  Covid-19 declaration under Section 564(b)(1) of the Act, 21  U.S.C. section 360bbb-3(b)(1), unless the authorization is  terminated or revoked. Performed at [facility], [address], [place], [place] [address]   MRSA PCR Screening     Status: None   Collection Time: 11/23/19 11:21 PM   Specimen: Nasal Mucosa; Nasopharyngeal  Result Value Ref Range Status   MRSA by PCR NEGATIVE NEGATIVE Final    Comment:        The GeneXpert MRSA Assay (FDA approved for NASAL specimens only), is one component of a comprehensive MRSA colonization surveillance program. It is not intended to diagnose MRSA infection nor  to guide or monitor treatment for MRSA infections. Performed at [facility], [address] [place], [place] [address]   Culture, blood (routine x 2)     Status: None   Collection Time: 11/24/19  3:07 AM   Specimen: BLOOD  Result Value Ref Range Status   Specimen Description BLOOD RIGHT HAND  Final   Special Requests   Final    BOTTLES DRAWN AEROBIC AND ANAEROBIC Blood Culture adequate volume   Culture   Final    NO GROWTH 5 DAYS Performed at [facility], [address] [place], [place] [address]    Report Status 11/29/2019 FINAL  Final  Culture, blood (routine x 2)     Status: None   Collection Time: 11/24/19  3:20 AM   Specimen: BLOOD  Result Value Ref Range Status   Specimen Description BLOOD RIGHT HAND  Final   Special Requests   Final    BOTTLES DRAWN AEROBIC AND ANAEROBIC Blood Culture results may not be optimal due to an inadequate volume of blood received in culture bottles   Culture   Final    NO GROWTH 5 DAYS Performed at [facility], [address], [place], [place] [address]    Report Status 11/29/2019 FINAL  Final  Culture, respiratory (non-expectorated)     Status: None   Collection Time: 11/24/19  2:52 PM   Specimen: Tracheal Aspirate; Respiratory  Result Value Ref Range Status   Specimen Description TRACHEAL ASPIRATE  Final   Special Requests NONE  Final   Gram Stain   Final  MODERATE WBC PRESENT,BOTH PMN AND MONONUCLEAR MODERATE GRAM POSITIVE COCCI IN PAIRS IN CLUSTERS FEW GRAM NEGATIVE COCCOBACILLI    Culture   Final    MODERATE Consistent with normal respiratory flora. Performed at [facility], [address] [place], [place] [address]    Report Status 12/07/2019 FINAL  Final    Lab Basic Metabolic Panel: Recent Labs  Lab 11/22/19 2212 11/23/19 0500 11/23/19 2345 11/24/19 0550 11/24/19 0810 11/24/19 1607 11/24/19 2105 11/24/19 2130 12/15/2019 0210 12/03/2019 0216  NA  --  137   < > 144   < > 144 142 144 143  144  K  --  3.9   < > 3.7   < > 3.7 4.5 4.7 4.9 5.0  CL  --  99  --  102  --   --   --  106  --  108  CO2  --  25  --  20*  --   --   --  21*  --  19*  GLUCOSE  --  170*  --  337*  --   --   --  195*  --  180*  BUN  --  19  --  31*  --   --   --  50*  --  57*  CREATININE  --  0.78  --  2.36*  --   --   --  2.40*  --  2.75*  CALCIUM  --  8.8*  --  8.8*  --   --   --  7.4*  --  6.8*  MG 2.4  --   --  2.8*  --   --   --   --   --   --    < > = values in this interval not displayed.   Liver Function Tests: Recent Labs  Lab 11/23/19 0500 11/24/19 0550 12/08/2019 0216  AST 40 59* 46*  ALT 29 36 35  ALKPHOS 55 99 75  BILITOT 0.6 1.3* 1.1  PROT 6.7 7.3 5.3*  ALBUMIN 2.5* 2.8* 2.2*   No results for input(s): LIPASE, AMYLASE in the last 168 hours. No results for input(s): AMMONIA in the last 168 hours. CBC: Recent Labs  Lab 11/23/19 0500 11/23/19 2345 11/24/19 0446 11/24/19 0810 11/24/19 1326 11/24/19 1607 11/24/19 1942 11/24/19 2105 12/08/2019 0018 12/22/2019 0210 12/15/2019 0216  WBC 14.0*  --  28.7*  --  28.1*  --  22.9*  --  21.7*  --  22.7*  NEUTROABS 12.1*  --   --   --   --   --   --   --  18.5*  --  18.4*  HGB 15.1   < > 14.5   < > 16.0   < > 15.2 14.6 13.3 12.9* 13.0  HCT 44.8   < > 46.6   < > 49.5   < > 46.8 43.0 41.7 38.0* 42.3  MCV 92.4  --  100.6*  --  98.8  --  97.7  --  98.1  --  100.5*  PLT 258  --  260  --  196  --  167  --  147*  --  154   < > = values in this interval not displayed.   Cardiac Enzymes: No results for input(s): CKTOTAL, CKMB, CKMBINDEX, TROPONINI in the last 168 hours. Sepsis Labs: Recent Labs  Lab 11/24/19 1326 11/24/19 1942 12/13/2019 0018 12/13/2019 0216  WBC 28.1* 22.9* 21.7* 22.7*  LATICACIDVEN  --  4.1* 3.4*  --     Procedures/Operations  1/30 endotracheal tube   Elige RadonBradley L Arvil Utz 11/29/2019, 3:59 PM

## 2019-12-23 NOTE — Plan of Care (Signed)
  Problem: Respiratory: Goal: Will maintain a patent airway Outcome: Progressing   Problem: Pain Managment: Goal: General experience of comfort will improve Outcome: Progressing   Problem: Clinical Measurements: Goal: Diagnostic test results will improve Outcome: Not Progressing Goal: Cardiovascular complication will be avoided Outcome: Not Progressing Note: Pt was started on multiple vasopressors overnight. All are running at maximum dose at this time.   Problem: Activity: Goal: Risk for activity intolerance will decrease Outcome: Not Progressing Note: Pt bed bound. Chemically paralyzed   Problem: Nutrition: Goal: Adequate nutrition will be maintained Outcome: Not Progressing Note: Pt not currently on tube feeds. Unable to place NG/OG after multiple attempts

## 2019-12-23 NOTE — Progress Notes (Signed)
Notified Photographer at [facility] about pt's blood pressure steadily decreasing again, MAP maintaining in the low 60s and mid 50s. Planned to continue to monitor and wait for labs that were just sent.  This RN switched pt to quad strength levo and pt's BP decreased to a MAP in the 40s, did not recover after 15 mins. Called [facility] again and Dr. Roxan Hockey ordered NEO gtt. Ground team was sent to assess pt.

## 2019-12-23 NOTE — Progress Notes (Signed)
Matthew Mendez and Dr. Alveta Heimlich regarding pt's sedation and analgesic orders for paralyzing protocol. Also seeking clarification on blood pressure gtts. Vernona Rieger reports she will place orders.

## 2019-12-23 DEATH — deceased

## 2020-08-26 IMAGING — DX DG CHEST 1V PORT
1 series · 1 of 1 positions shown · non-contrast
Comparison: 04/22/2015

CLINICAL DATA: Pt INIRIO [REDACTED] from home, c/o shortness of breath and
chest pain when coughing. Diagnosed with COVID on [DATE]. On EMS
arrival, pt 75% on room air, improved to 90% on simple mask @ 9L. On
arrival to ED, pt 74% on room air, improved to 90% on 15L. Pt A&O
x 4, denies pain at this time.

EXAM:
PORTABLE CHEST 1 VIEW

[chest]
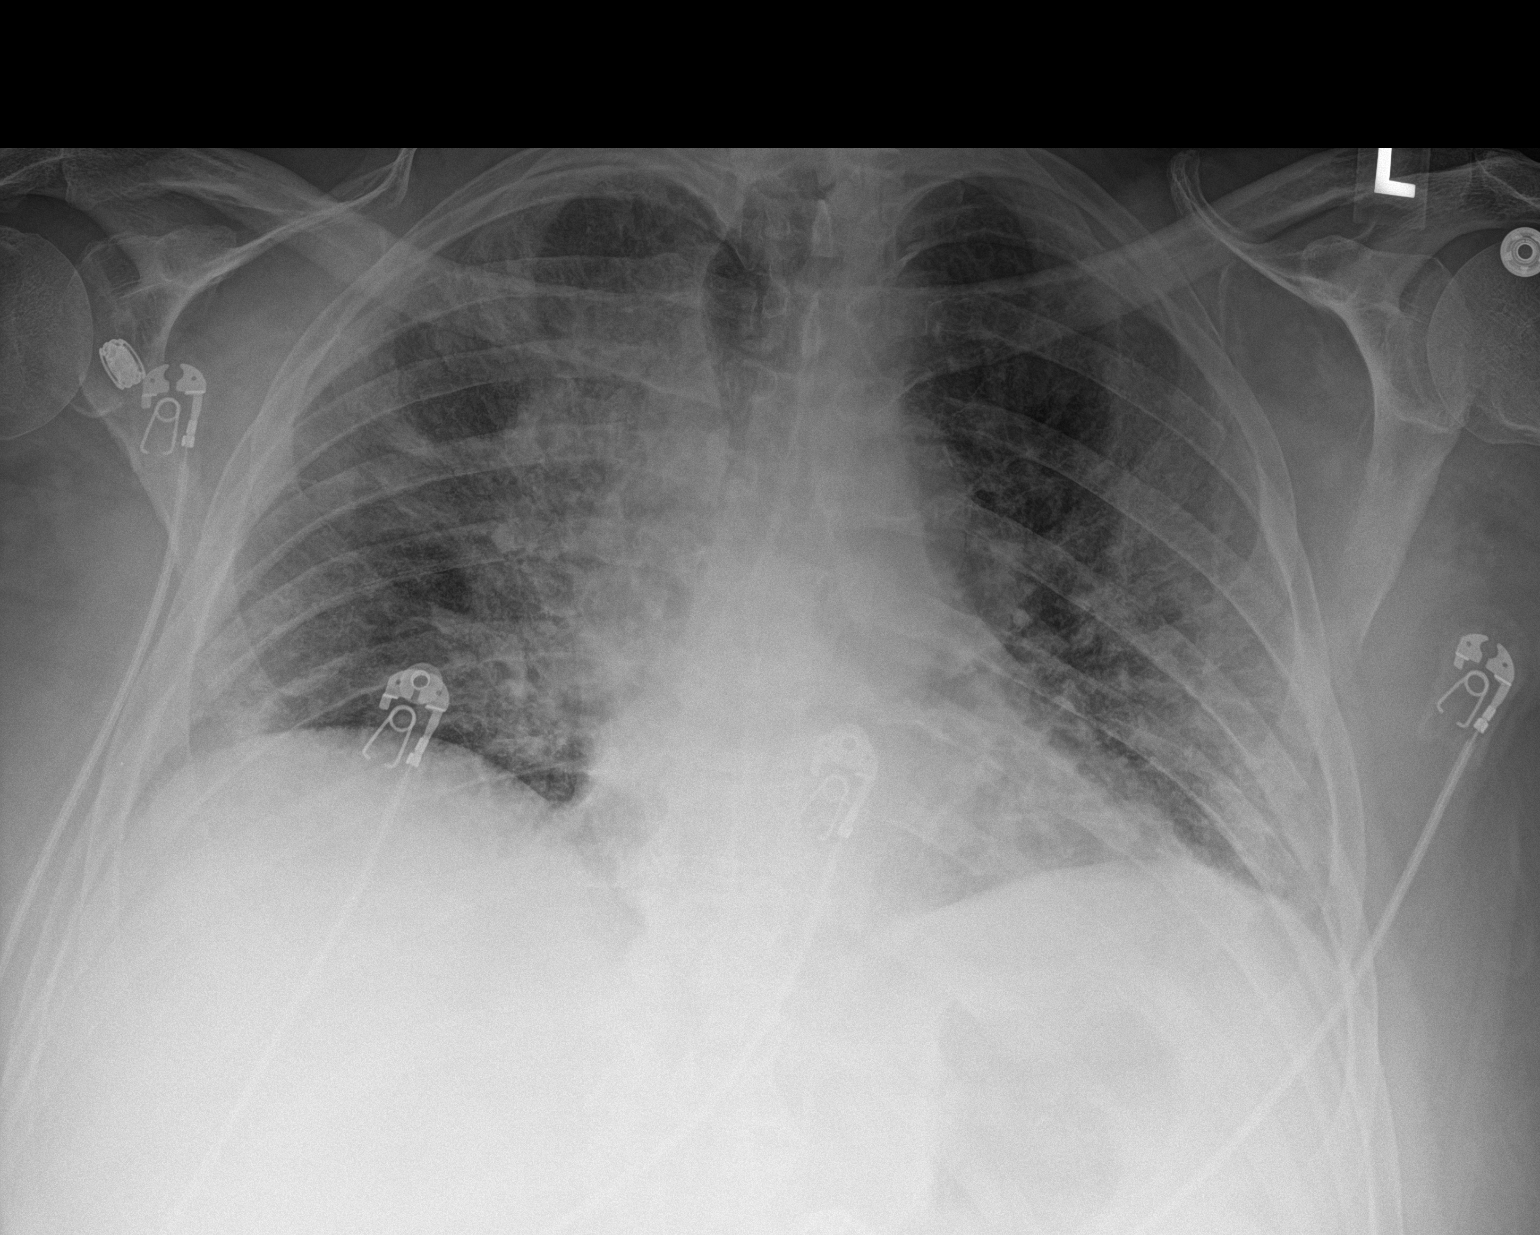

[1 of 1 positions shown; findings below may reference images not displayed]

FINDINGS: There are bilateral hazy airspace opacities, most confluent in the
right perihilar and medial upper lung zone, all new since the prior
chest radiograph.

No pleural effusion or pneumothorax.

Cardiac silhouette is normal in size. No convincing mediastinal or
hilar masses.

Skeletal structures are grossly intact.
IMPRESSION: 1. Bilateral airspace lung opacities consistent with multifocal
[id number] pneumonia.

## 2020-08-26 IMAGING — CT CT ANGIO CHEST
2 of 7 series · 18 of 46 positions shown · IV contrast (OMNI)
Comparison: Same day chest radiograph

CLINICAL DATA: Shortness of breath, ITGHB-2P positive

EXAM:
CT ANGIOGRAPHY CHEST WITH CONTRAST
TECHNIQUE: Multidetector CT imaging of the chest was performed using the
standard protocol during bolus administration of intravenous
contrast. Multiplanar CT image reconstructions and MIPs were
obtained to evaluate the vascular anatomy.
CONTRAST:  100mL OMNIPAQUE IOHEXOL 350 MG/ML SOLN

[Series 5: thins · axial · 0.95mm/px · z∈[+1100,+1388]mm · 15 of 323 slices shown]
[im 17/323  lung]
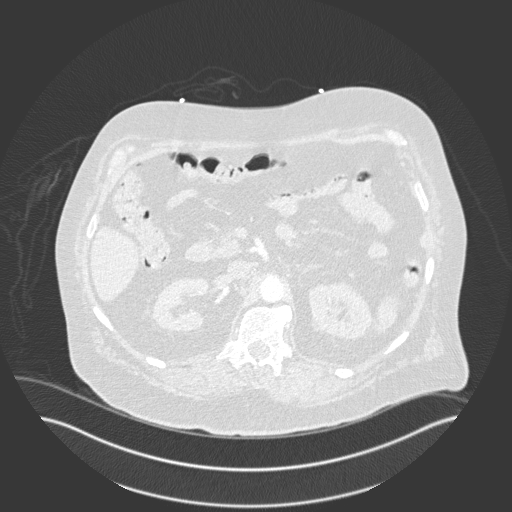
[im 34/323  soft-tissue]
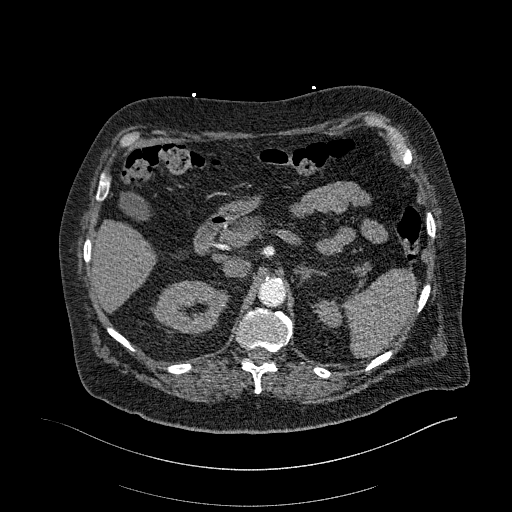
[im 68/323  lung]
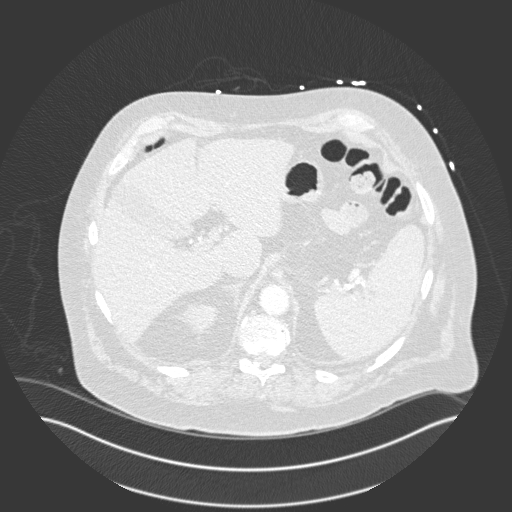
[im 85/323  soft-tissue]
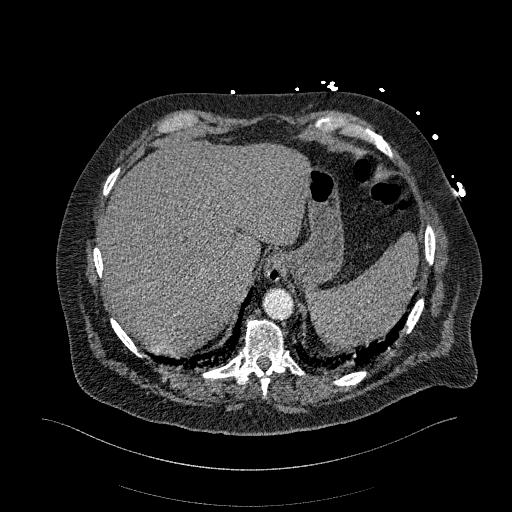
[im 102/323  lung]
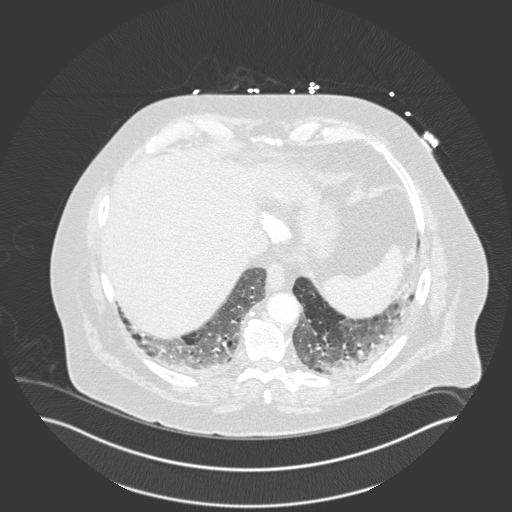
[im 119/323  soft-tissue]
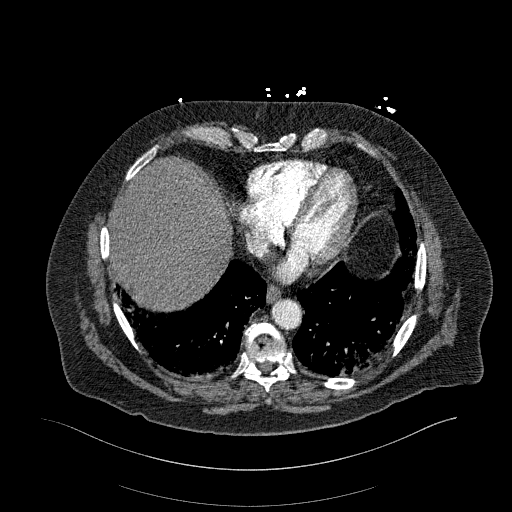
[im 136/323  lung]
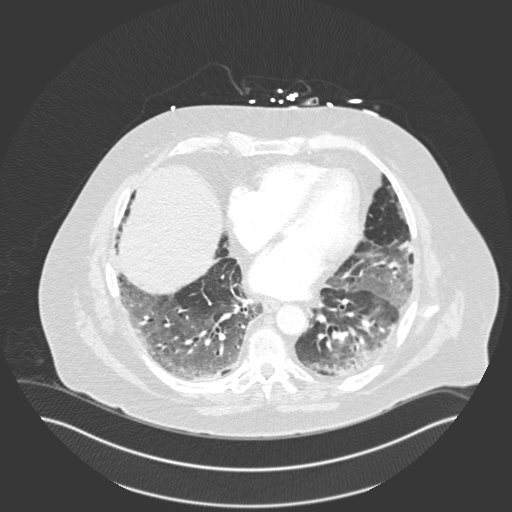
[im 170/323  soft-tissue]
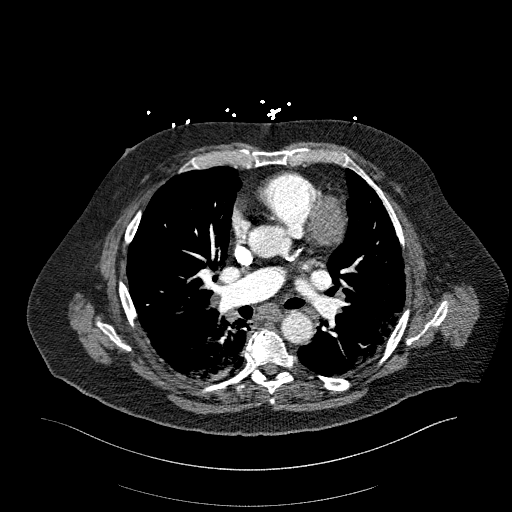
[im 187/323  lung]
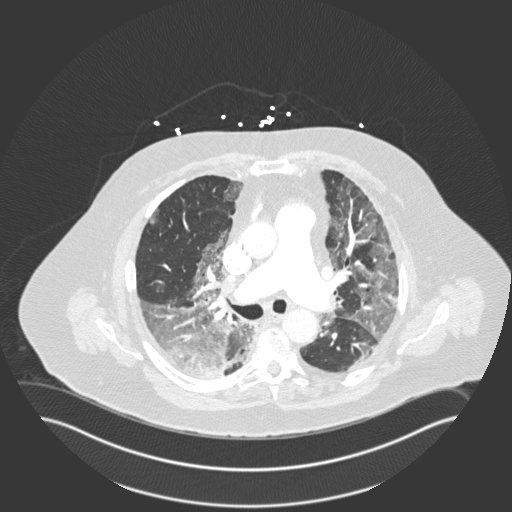
[im 204/323  soft-tissue]
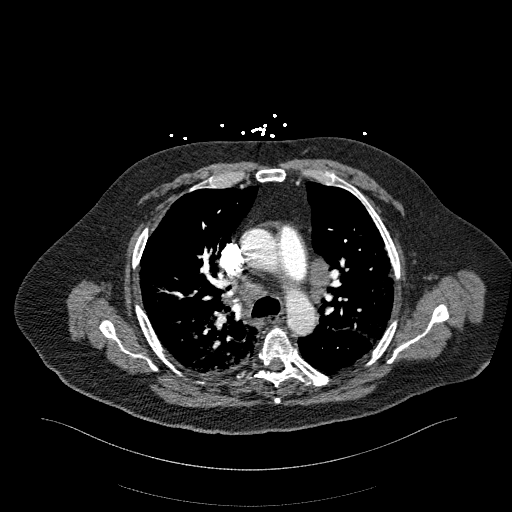
[im 221/323  lung]
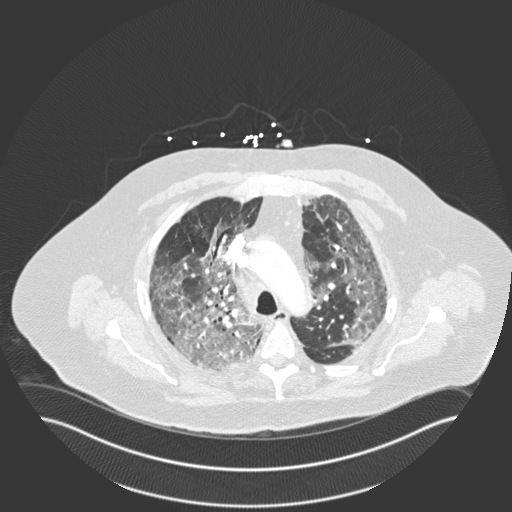
[im 238/323  soft-tissue]
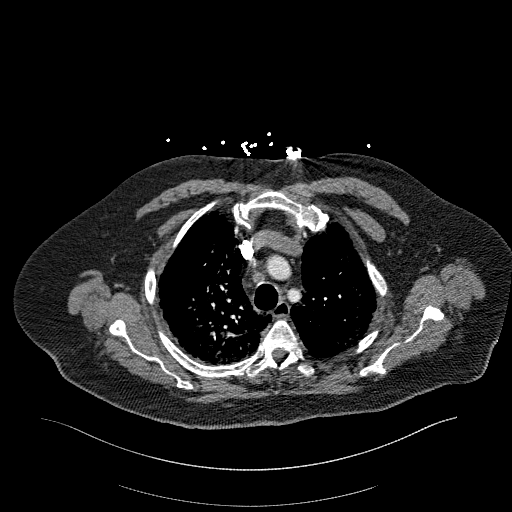
[im 272/323  lung]
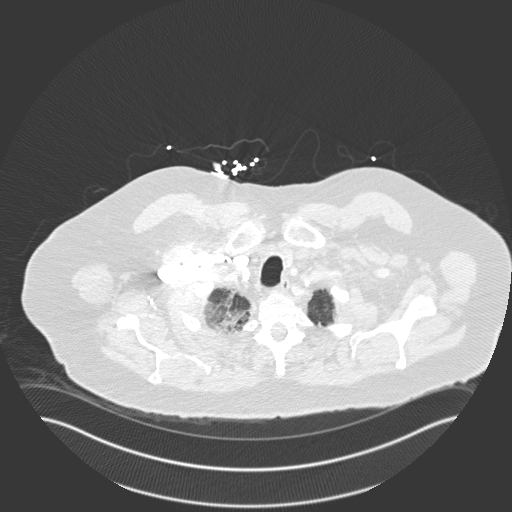
[im 289/323  soft-tissue]
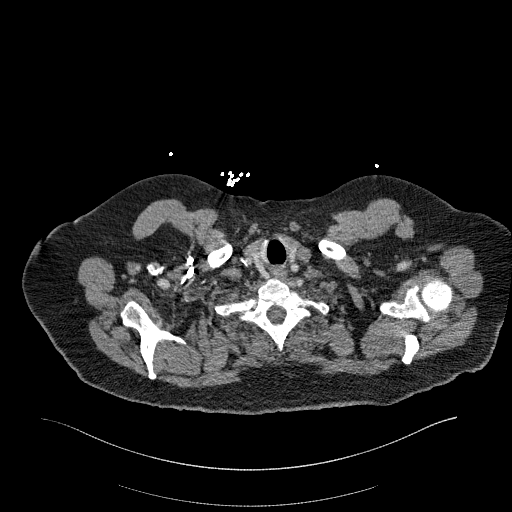
[im 306/323  lung]
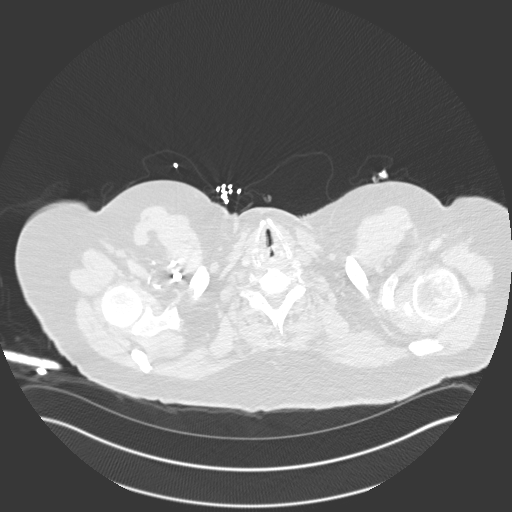

[Series 7: coronal mpr · coronal · 0.67mm/px · 3 of 161 slices shown]
[im 41/161  soft-tissue]
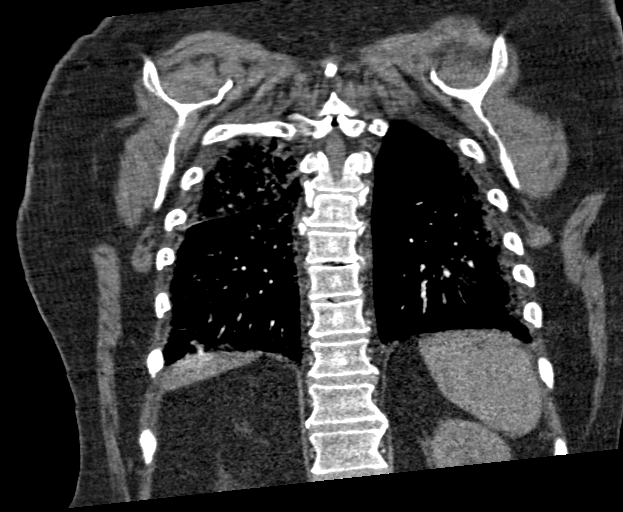
[im 81/161  soft-tissue]
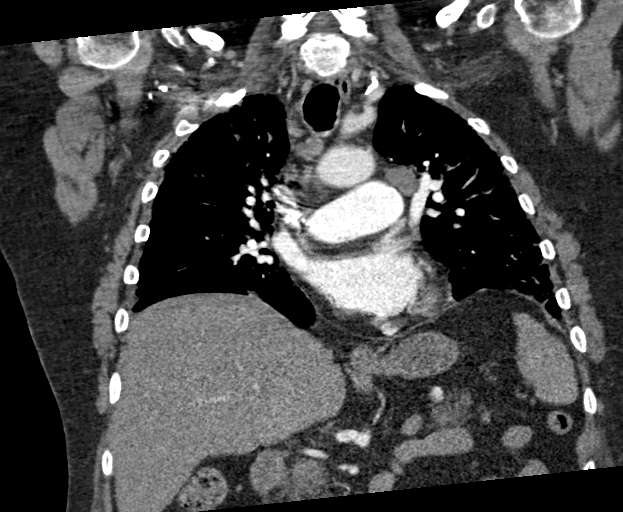
[im 121/161  soft-tissue]
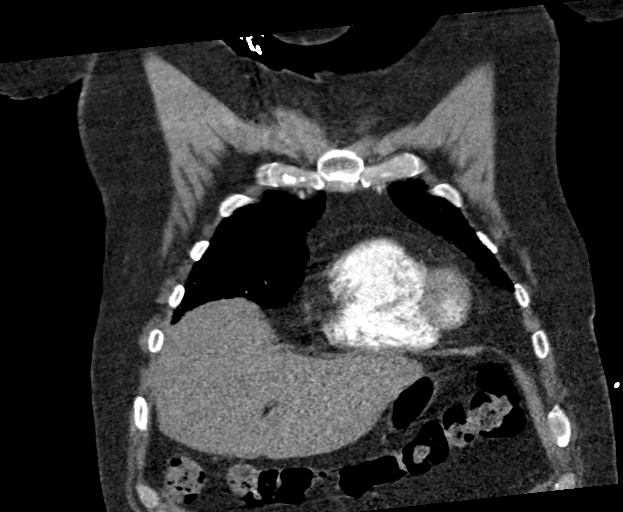

[18 of 46 positions shown; findings below may reference images not displayed]

FINDINGS: Cardiovascular: Satisfactory opacification of the pulmonary arteries
to the segmental level. No evidence of pulmonary embolism. Normal
heart size. No pericardial effusion. Thoracic aorta is
nonaneurysmal. Atherosclerotic calcifications of the aorta and
coronary arteries.

Mediastinum/Nodes: Mediastinal lymphadenopathy. Reference nodes
include AP window node measuring 19 mm short axis (series 4, image
39) and precarinal node measuring 17 mm (series 4, image 42). Mildly
prominent bilateral hilar lymph nodes. No axillary lymphadenopathy.
Unremarkable thyroid gland, trachea, and esophagus.

Lungs/Pleura: Extensive ground-glass opacities throughout both
lungs, largely in a peripheral distribution. No pleural effusion or
pneumothorax.

Upper Abdomen: No acute findings.

Musculoskeletal: No chest wall abnormality. No acute or significant
osseous findings.

Review of the MIP images confirms the above findings.
IMPRESSION: 1. No evidence of acute pulmonary embolism.
2. Extensive ground-glass opacities throughout both lungs,
compatible with multifocal pneumonia in the setting of known COVID
19 infection.
3. Mediastinal lymphadenopathy, likely reactive.
4. Aortic and coronary artery atherosclerosis.

## 2020-08-28 IMAGING — DX DG CHEST 1V PORT
1 series · 1 of 1 positions shown · non-contrast
Comparison: 11/22/2019

CLINICAL DATA: COVID pneumonia

EXAM:
PORTABLE CHEST 1 VIEW

[chest ap]
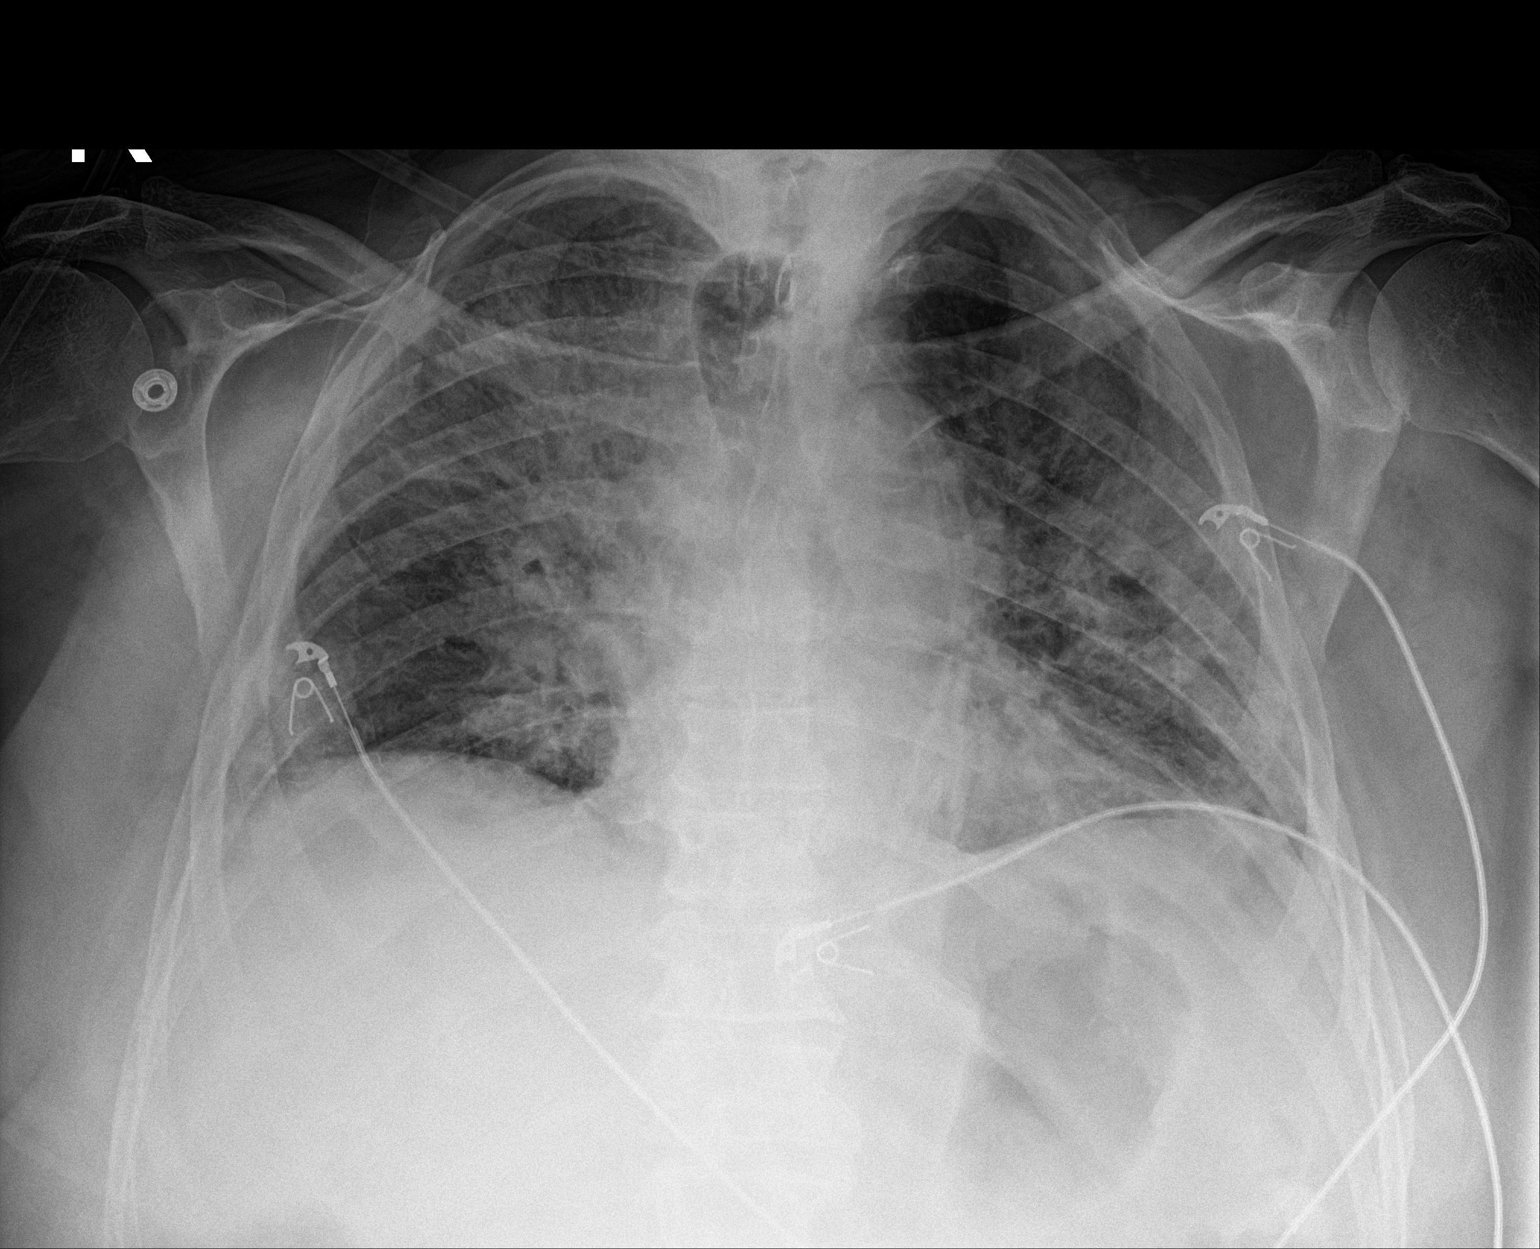

[1 of 1 positions shown; findings below may reference images not displayed]

FINDINGS: There are diffuse bilateral hazy airspace opacities which have
significantly worsened since the prior study. Heart size is
essentially unchanged from prior study. There may be small bilateral
pleural effusions. There is no pneumothorax. No acute osseous
abnormality.
IMPRESSION: Worsening multifocal airspace opacities with compared to prior
study. Findings are consistent with the patient's history of viral
pneumonia. Underlying pulmonary edema is difficult to exclude in
this patient.
